# Patient Record
Sex: Female | Born: 1995 | Race: Black or African American | Hispanic: No | Marital: Single | Smoking: Never smoker
Health system: Southern US, Community
[De-identification: ages and names within clinical notes are randomized; demographics above are authoritative.]

## PROBLEM LIST (undated history)

## (undated) DIAGNOSIS — N3010 Interstitial cystitis (chronic) without hematuria: Secondary | ICD-10-CM

## (undated) HISTORY — PX: NO PAST SURGERIES: SHX2092

---

## 2017-06-03 DIAGNOSIS — N3010 Interstitial cystitis (chronic) without hematuria: Secondary | ICD-10-CM | POA: Insufficient documentation

## 2019-11-26 ENCOUNTER — Emergency Department (HOSPITAL_COMMUNITY)
Admission: EM | Admit: 2019-11-26 | Discharge: 2019-11-26 | Disposition: A | Discharge status: Home / Self Care | Payer: No Typology Code available for payment source | Attending: Emergency Medicine | Admitting: Emergency Medicine

## 2019-11-26 ENCOUNTER — Encounter (HOSPITAL_COMMUNITY): Payer: Self-pay | Admitting: Emergency Medicine

## 2019-11-26 ENCOUNTER — Emergency Department (HOSPITAL_COMMUNITY): Payer: No Typology Code available for payment source

## 2019-11-26 DIAGNOSIS — Y93I9 Activity, other involving external motion: Secondary | ICD-10-CM | POA: Diagnosis not present

## 2019-11-26 DIAGNOSIS — R519 Headache, unspecified: Secondary | ICD-10-CM | POA: Diagnosis not present

## 2019-11-26 DIAGNOSIS — Y92410 Unspecified street and highway as the place of occurrence of the external cause: Secondary | ICD-10-CM | POA: Diagnosis not present

## 2019-11-26 DIAGNOSIS — M25512 Pain in left shoulder: Secondary | ICD-10-CM | POA: Insufficient documentation

## 2019-11-26 DIAGNOSIS — R6884 Jaw pain: Secondary | ICD-10-CM | POA: Diagnosis not present

## 2019-11-26 DIAGNOSIS — Y999 Unspecified external cause status: Secondary | ICD-10-CM | POA: Diagnosis not present

## 2019-11-26 MED ORDER — METHOCARBAMOL 500 MG PO TABS
500.0000 mg | ORAL_TABLET | Freq: Two times a day (BID) | ORAL | 0 refills | Status: DC
Start: 2019-11-26 — End: 2022-09-04

## 2019-11-26 MED ORDER — LIDOCAINE 5 % EX PTCH: 1.0000 | MEDICATED_PATCH | CUTANEOUS | 0 refills | Status: DC

## 2019-11-26 MED ORDER — ACETAMINOPHEN 500 MG PO TABS
1000.0000 mg | ORAL_TABLET | Freq: Once | ORAL | Status: AC
  Administered 2019-11-26: 1000 mg via ORAL
  Filled 2019-11-26: qty 2
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
## 2019-11-26 NOTE — Discharge Instructions (Signed)
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
## 2022-03-19 ENCOUNTER — Ambulatory Visit
Admission: EM | Admit: 2022-03-19 | Discharge: 2022-03-19 | Disposition: A | Discharge status: Home / Self Care | Payer: Commercial Managed Care - PPO | Attending: Physician Assistant | Admitting: Physician Assistant

## 2022-03-19 DIAGNOSIS — J069 Acute upper respiratory infection, unspecified: Secondary | ICD-10-CM

## 2022-03-19 DIAGNOSIS — Z1152 Encounter for screening for COVID-19: Secondary | ICD-10-CM | POA: Diagnosis not present

## 2022-03-19 LAB — RESP PANEL BY RT-PCR (FLU A&B, COVID) ARPGX2
Influenza A by PCR: NEGATIVE
Influenza B by PCR: NEGATIVE
SARS Coronavirus 2 by RT PCR: NEGATIVE

## 2022-03-19 NOTE — ED Triage Notes (Signed)
Pt presents with generalized body aches, bilateral ear pain, ongoing headache, sore throat, and chills X 2 days.

## 2022-03-19 NOTE — ED Provider Notes (Signed)
[facility]    CSN: [id number] Arrival date & time: 03/19/22  1515      History   Chief Complaint Chief Complaint  Patient presents with   Chills   Generalized Body Aches   Headache   Sore Throat    HPI Courtney Huang is a 26 y.o. female.   Patient here today for evaluation of generalized body aches, bilateral ear pain, headache, sore throat and chills she has had 2 days.  Fever has been as high as 102 Fahrenheit.  She has tried over-the-counter medication without significant relief.  She denies any diarrhea but has had some nausea and vomiting.  The history is provided by the patient.  Headache Associated symptoms: congestion, cough, fever, nausea, sore throat and vomiting   Associated symptoms: no abdominal pain, no diarrhea and no ear pain   Sore Throat Associated symptoms include headaches. Pertinent negatives include no abdominal pain and no shortness of breath.    History reviewed. No pertinent past medical history.  There are no problems to display for this patient.   History reviewed. No pertinent surgical history.  OB History   No obstetric history on file.      Home Medications    Prior to Admission medications   Medication Sig Start Date End Date Taking? Authorizing Provider  lidocaine (LIDODERM) 5 % Place 1 patch onto the skin daily. Remove & Discard patch within 12 hours or as directed by MD 11/26/19   Henderly, Britni A, PA-C  methocarbamol (ROBAXIN) 500 MG tablet Take 1 tablet (500 mg total) by mouth 2 (two) times daily. 11/26/19   Henderly, Britni A, PA-C    Family History Family History  Family history unknown: Yes    Social History Social History   Tobacco Use   Smoking status: Never   Smokeless tobacco: Never     Allergies   Patient has no known allergies.   Review of Systems Review of Systems  Constitutional:  Positive for chills, diaphoresis and fever.  HENT:  Positive for congestion and sore throat.  Negative for ear pain.   Eyes:  Negative for discharge and redness.  Respiratory:  Positive for cough. Negative for shortness of breath and wheezing.   Gastrointestinal:  Positive for nausea and vomiting. Negative for abdominal pain and diarrhea.  Neurological:  Positive for headaches.     Physical Exam Triage Vital Signs ED Triage Vitals  Enc Vitals Group     BP 03/19/22 1630 121/85     Pulse Rate 03/19/22 1630 84     Resp 03/19/22 1630 18     Temp 03/19/22 1630 99.1 F (37.3 C)     Temp Source 03/19/22 1630 Oral     SpO2 03/19/22 1630 98 %     Weight --      Height --      Head Circumference --      Peak Flow --      Pain Score 03/19/22 1634 7     Pain Loc --      Pain Edu? --      Excl. in GC? --    No data found.  Updated Vital Signs BP 121/85 (BP Location: Right Arm)   Pulse 84   Temp 99.1 F (37.3 C) (Oral)   Resp 18   LMP 03/14/2022   SpO2 98%   Physical Exam Vitals and nursing note reviewed.  Constitutional:      General: She is not in acute distress.  Appearance: Normal appearance. She is not ill-appearing.  HENT:     Head: Normocephalic and atraumatic.     Nose: Congestion present.     Mouth/Throat:     Mouth: Mucous membranes are moist.     Pharynx: No oropharyngeal exudate or posterior oropharyngeal erythema.  Eyes:     Conjunctiva/sclera: Conjunctivae normal.  Cardiovascular:     Rate and Rhythm: Normal rate and regular rhythm.     Heart sounds: Normal heart sounds. No murmur heard. Pulmonary:     Effort: Pulmonary effort is normal. No respiratory distress.     Breath sounds: Normal breath sounds. No wheezing, rhonchi or rales.  Skin:    General: Skin is warm and dry.  Neurological:     Mental Status: She is alert.  Psychiatric:        Mood and Affect: Mood normal.        Thought Content: Thought content normal.      UC Treatments / Results  Labs (all labs ordered are listed, but only abnormal results are displayed) Labs Reviewed   RESP PANEL BY RT-PCR (FLU A&B, COVID) [id number]    EKG   Radiology No results found.  Procedures Procedures (including critical care time)  Medications Ordered in UC Medications - No data to display  Initial Impression / Assessment and Plan / UC Course  I have reviewed the triage vital signs and the nursing notes.  Pertinent labs & imaging results that were available during my care of the patient were reviewed by me and considered in my medical decision making (see chart for details).    Suspect viral etiology of symptoms.  Will screen for COVID and flu.  Will await results further recommendation.  Encouraged follow-up with any further concerns and recommend symptomatic treatment while awaiting results.  Final Clinical Impressions(s) / UC Diagnoses   Final diagnoses:  Acute upper respiratory infection  Encounter for screening for COVID-19   Discharge Instructions   None    ED Prescriptions   None    PDMP not reviewed this encounter.   Tomi Bamberger, PA-C 03/19/22 1714

## 2022-07-09 NOTE — L&D Delivery Note (Cosign Needed Addendum)
[facility] Delivery Note  Courtney Huang is a 27 y.o. G1P0 s/p SVD at [redacted]w[redacted]d. She was admitted for IOL 2/2 pre-eclampsia without severe features.   ROM: 3h 12m with clear fluid GBS Status: NEGATIVE/-- (09/07 1807) Maximum Maternal Temperature: 98.56F   Labor Progress: Initial SVE: 0.5/thick/-3. She then progressed to complete.   Delivery Date/Time: 03/17/23 0636 Delivery: Called to room and patient was complete and pushing. Head delivered LOA. No nuchal cord present. Shoulder and body delivered in usual fashion. Infant with spontaneous cry, placed on mother's abdomen, dried and stimulated. Cord clamped x 2 after 1-minute delay, and cut by father of baby. Cord blood drawn. Placenta delivered spontaneously with gentle cord traction. Fundus firm with massage and Pitocin. Labia, perineum, vagina, and cervix inspected inspected with a small first degree laceration which was repaired in the usual fashion. Following repair, patient noted to have trickling of blood -- TXA given at this time. If cont to have trickling, would consider Cytotec, Hemabate. Baby Weight: pending  Placenta: Sent to L&D Complications: None Lacerations: small first degree perineal laceration - repaired EBL: 202 mL Analgesia: Unmedicated   Infant:  APGAR (1 MIN): 9  APGAR (5 MINS): 9

## 2022-09-04 ENCOUNTER — Ambulatory Visit: Payer: Managed Care, Other (non HMO)

## 2022-09-04 VITALS — BP 130/82 | HR 67 | Ht 66.0 in | Wt 129.0 lb

## 2022-09-04 DIAGNOSIS — Z3490 Encounter for supervision of normal pregnancy, unspecified, unspecified trimester: Secondary | ICD-10-CM

## 2022-09-04 DIAGNOSIS — Z3201 Encounter for pregnancy test, result positive: Secondary | ICD-10-CM

## 2022-09-04 DIAGNOSIS — Z3200 Encounter for pregnancy test, result unknown: Secondary | ICD-10-CM

## 2022-09-04 DIAGNOSIS — O219 Vomiting of pregnancy, unspecified: Secondary | ICD-10-CM

## 2022-09-04 LAB — POCT PREGNANCY, URINE: Preg Test, Ur: POSITIVE — AB

## 2022-09-04 MED ORDER — PROMETHAZINE HCL 12.5 MG PO TABS: 12.5000 mg | ORAL_TABLET | Freq: Four times a day (QID) | ORAL | 1 refills | Status: DC | PRN

## 2022-09-04 MED ORDER — METOCLOPRAMIDE HCL 5 MG PO TABS: 5.0000 mg | ORAL_TABLET | Freq: Three times a day (TID) | ORAL | 1 refills | Status: DC

## 2022-09-04 NOTE — Progress Notes (Signed)
Possible Pregnancy  Here today for pregnancy confirmation. UPT in office today is positive. Pt reports first positive home UPT 07/09/23. This is an unplanned but desired pregnancy. Pt reports family and partner are very supportive. Reviewed dating with patient:   LMP: 06/08/22 EDD: 03/15/23 12w 4d today FHR 172  OB history reviewed; this is first pregnancy. Reviewed medications and allergies with patient. Reports N/V for past 2 months that has led to weight loss from 140 to 129 lbs. Unable to take prenatal vit due to nausea. Ernestina Patches, MD recommends scheduled Reglan, PRN Phenergan, and vit b6 supplement. Pt will return tomorrow, 09/04/22 for new OB visit with Ernestina Patches, MD.  Annabell Howells, RN 09/04/2022  1:55 PM

## 2022-09-04 NOTE — Patient Instructions (Signed)
Pick up Vitamin B6 at pharmacy!

## 2022-09-05 ENCOUNTER — Other Ambulatory Visit: Payer: Self-pay

## 2022-09-05 ENCOUNTER — Other Ambulatory Visit (HOSPITAL_COMMUNITY)
Admission: RE | Admit: 2022-09-05 | Discharge: 2022-09-05 | Disposition: A | Discharge status: Home / Self Care | Payer: Managed Care, Other (non HMO) | Source: Ambulatory Visit | Attending: Family Medicine | Admitting: Family Medicine

## 2022-09-05 ENCOUNTER — Ambulatory Visit (INDEPENDENT_AMBULATORY_CARE_PROVIDER_SITE_OTHER): Payer: Managed Care, Other (non HMO) | Admitting: Family Medicine

## 2022-09-05 VITALS — BP 132/79 | HR 76 | Wt 127.4 lb

## 2022-09-05 DIAGNOSIS — Z3490 Encounter for supervision of normal pregnancy, unspecified, unspecified trimester: Secondary | ICD-10-CM | POA: Insufficient documentation

## 2022-09-05 DIAGNOSIS — Z8279 Family history of other congenital malformations, deformations and chromosomal abnormalities: Secondary | ICD-10-CM

## 2022-09-05 DIAGNOSIS — O219 Vomiting of pregnancy, unspecified: Secondary | ICD-10-CM | POA: Diagnosis not present

## 2022-09-05 DIAGNOSIS — Z3A08 8 weeks gestation of pregnancy: Secondary | ICD-10-CM

## 2022-09-05 NOTE — Progress Notes (Signed)
INITIAL PRENATAL VISIT  Subjective:   Courtney Huang is being seen today for her first obstetrical visit.  This is a planned pregnancy. This is a desired pregnancy.  She is at 93w6dgestation by LMP. Her obstetrical history is significant for  Nausea/vomiting in pregnancy . Relationship with FOB: spouse, living together. Patient does intend to breast feed. Pregnancy history fully reviewed.  Patient reports no complaints.  Indications for ASA therapy (per uptodate) One of the following: Previous pregnancy with preeclampsia, especially early onset and with an adverse outcome No Multifetal gestation No Chronic hypertension No Type 1 or 2 diabetes mellitus No Chronic kidney disease No Autoimmune disease (antiphospholipid syndrome, systemic lupus erythematosus) No  Two or more of the following: Nulliparity Yes Obesity (body mass index >30 kg/m2) No Family history of preeclampsia in mother or sister No Age ?35 years No Sociodemographic characteristics (African American race, low socioeconomic level) Yes Personal risk factors (eg, previous pregnancy with low birth weight or small for gestational age infant, previous adverse pregnancy outcome [eg, stillbirth], interval >10 years between pregnancies) No  Indications for early GDM screening  First-degree relative with diabetes No BMI >30kg/m2 No Age > 25 Yes Previous birth of an infant weighing ?4000 g No Gestational diabetes mellitus in a previous pregnancy No Glycated hemoglobin ?5.7 percent (39 mmol/mol), impaired glucose tolerance, or impaired fasting glucose on previous testing No High-risk race/ethnicity (eg, African American, Latino, Native American, Asian American, Pacific Islander) Yes Previous stillbirth of unknown cause No Maternal birthweight > 9 lbs No History of cardiovascular disease No Hypertension or on therapy for hypertension No High-density lipoprotein cholesterol level <35 mg/dL (0.90 mmol/L) and/or a  triglyceride level >250 mg/dL (2.82 mmol/L) No Polycystic ovary syndrome No Physical inactivity No Other clinical condition associated with insulin resistance (eg, severe obesity, acanthosis nigricans) No Current use of glucocorticoids No   Early screening tests: FBS, A1C, Random CBG, glucose challenge   Review of Systems:   Review of Systems  Objective:    Obstetric History OB History  Gravida Para Term Preterm AB Living  1            SAB IAB Ectopic Multiple Live Births               # Outcome Date GA Lbr Len/2nd Weight Sex Delivery Anes PTL Lv  1 Current             No past medical history on file.  No past surgical history on file.  Current Outpatient Medications on File Prior to Visit  Medication Sig Dispense Refill   Prenatal Vit-Fe Fumarate-FA (MULTIVITAMIN-PRENATAL) 27-0.8 MG TABS tablet Take 1 tablet by mouth daily at 12 noon.     promethazine (PHENERGAN) 12.5 MG tablet Take 1 tablet (12.5 mg total) by mouth every 6 (six) hours as needed for nausea or vomiting. 30 tablet 1   Meth-Hyo-M Bl-Na Phos-Ph Sal (URIBEL) 118 MG CAPS Take by mouth. (Patient not taking: Reported on 09/04/2022)     metoCLOPramide (REGLAN) 5 MG tablet Take 1 tablet (5 mg total) by mouth every 8 (eight) hours. (Patient not taking: Reported on 09/05/2022) 90 tablet 1   phenazopyridine (PYRIDIUM) 100 MG tablet Take by mouth. (Patient not taking: Reported on 09/05/2022)     No current facility-administered medications on file prior to visit.    Allergies  Allergen Reactions   Other Hives    Social History:  reports that she has never smoked. She has never used smokeless tobacco.  Family History  Family history unknown: Yes    The following portions of the patient's history were reviewed and updated as appropriate: allergies, current medications, past family history, past medical history, past social history, past surgical history and problem list.  Review of Systems Review of Systems   Constitutional:  Negative for chills and fever.  HENT:  Negative for congestion and sore throat.   Eyes:  Negative for pain and visual disturbance.  Respiratory:  Negative for cough, chest tightness and shortness of breath.   Cardiovascular:  Negative for chest pain.  Gastrointestinal:  Negative for abdominal pain, diarrhea, nausea and vomiting.  Endocrine: Negative for cold intolerance and heat intolerance.  Genitourinary:  Negative for dysuria and flank pain.  Musculoskeletal:  Negative for back pain.  Skin:  Negative for rash.  Allergic/Immunologic: Negative for food allergies.  Neurological:  Negative for dizziness and light-headedness.  Psychiatric/Behavioral:  Negative for agitation.       Physical Exam:  BP 132/79   Pulse 76   Wt 127 lb 6.4 oz (57.8 kg)   LMP 07/05/2022   BMI 20.56 kg/m  CONSTITUTIONAL: Well-developed, well-nourished female in no acute distress.  HENT:  Normocephalic, atraumatic, External right and left ear normal. Oropharynx is clear and moist EYES: Conjunctivae normal. No scleral icterus.  NECK: Normal range of motion, supple, no masses.  Normal thyroid.  SKIN: Skin is warm and dry. No rash noted. Not diaphoretic. No erythema. No pallor. MUSCULOSKELETAL: Normal range of motion. No tenderness.  No cyanosis, clubbing, or edema.   NEUROLOGIC: Alert and oriented to person, place, and time. Normal muscle tone coordination.  PSYCHIATRIC: Normal mood and affect. Normal behavior. Normal judgment and thought content. CARDIOVASCULAR: Normal heart rate noted, regular rhythm RESPIRATORY: Clear to auscultation bilaterally. Effort and breath sounds normal, no problems with respiration noted. BREASTS: Symmetric in size. No masses, skin changes, nipple drainage, or lymphadenopathy. ABDOMEN: Soft, normal bowel sounds, no distention noted.  No tenderness, rebound or guarding. Fundal ht: 8wk PELVIC: Normal appearing external genitalia; normal appearing vaginal mucosa and  cervix.  No abnormal discharge noted.  Pap smear obtained.  Normal uterine size, no other palpable masses, no uterine or adnexal tenderness. FHR: 174   Assessment:    Pregnancy: G1P0000 1. Encounter for supervision of low-risk pregnancy, antepartum - question about dating-- we will refer for dating [place] to make sure we have the correct EDD - CBC/D/Plt+RPR+Rh+ABO+RubIgG... - Culture, OB Urine - Hemoglobin A1c - Cytology - PAP( [place]) - [place] MFM OB COMP + 14 WK; Future  2. Nausea and vomiting during pregnancy prior to [redacted] weeks gestation Taking phenergan q6 and dramamine -- reports not vomiting in the last 24 hrs. Previously was vomiting 4 times Has lost 10 lbs since beginning pregnancy Has not started reglan  3. Family history of congenital heart disease Father with PDA  Plan:    Initial labs drawn. Prenatal vitamins. Problem list reviewed and updated. Reviewed in detail the nature of the practice with collaborative care between  Genetic screening discussed: NIPS-- not yet gestational age for NIPS but will get this next week. . Role of ultrasound in pregnancy discussed; Anatomy US: ordered. Amniocentesis discussed: not indicated. Follow up in 4 weeks. Discussed clinic routines, schedule of care and testing, genetic screening options, involvement of students and residents under the direct supervision of APPs and doctors and presence of female providers. Pt verbalized understanding.  Return in about 4 weeks (around 10/03/2022) for Mom+Baby Combined Care.   Future Appointments  Date Time Provider [place]  09/14/2022 10:00 AM [facility]     Caren Macadam, MD 09/05/2022 4:00 PM

## 2022-09-06 LAB — HEMOGLOBIN A1C
Est. average glucose Bld gHb Est-mCnc: 108 mg/dL
Hgb A1c MFr Bld: 5.4 % (ref 4.8–5.6)

## 2022-09-06 LAB — CBC/D/PLT+RPR+RH+ABO+RUBIGG...
Antibody Screen: NEGATIVE
Basophils Absolute: 0 10*3/uL (ref 0.0–0.2)
Basos: 1 %
EOS (ABSOLUTE): 0.1 10*3/uL (ref 0.0–0.4)
Eos: 3 %
HCV Ab: NONREACTIVE
HIV Screen 4th Generation wRfx: NONREACTIVE
Hematocrit: 37.8 % (ref 34.0–46.6)
Hemoglobin: 12.4 g/dL (ref 11.1–15.9)
Hepatitis B Surface Ag: NEGATIVE
Immature Grans (Abs): 0 10*3/uL (ref 0.0–0.1)
Immature Granulocytes: 0 %
Lymphocytes Absolute: 1.4 10*3/uL (ref 0.7–3.1)
Lymphs: 32 %
MCH: 26.6 pg (ref 26.6–33.0)
MCHC: 32.8 g/dL (ref 31.5–35.7)
MCV: 81 fL (ref 79–97)
Monocytes Absolute: 0.4 10*3/uL (ref 0.1–0.9)
Monocytes: 8 %
Neutrophils Absolute: 2.4 10*3/uL (ref 1.4–7.0)
Neutrophils: 56 %
Platelets: 259 10*3/uL (ref 150–450)
RBC: 4.67 x10E6/uL (ref 3.77–5.28)
RDW: 15.8 % — ABNORMAL HIGH (ref 11.7–15.4)
RPR Ser Ql: NONREACTIVE
Rh Factor: POSITIVE
Rubella Antibodies, IGG: 3.87 index (ref >0.99)
WBC: 4.3 10*3/uL (ref 3.4–10.8)

## 2022-09-06 LAB — HCV INTERPRETATION

## 2022-09-07 LAB — URINE CULTURE, OB REFLEX

## 2022-09-07 LAB — CULTURE, OB URINE

## 2022-09-10 LAB — CYTOLOGY - PAP
Chlamydia: NEGATIVE
Comment: NEGATIVE
Comment: NEGATIVE
Comment: NEGATIVE
Comment: NORMAL
Diagnosis: UNDETERMINED — AB
High risk HPV: POSITIVE — AB
Neisseria Gonorrhea: NEGATIVE
Trichomonas: NEGATIVE

## 2022-09-11 ENCOUNTER — Encounter: Payer: Self-pay | Admitting: Family Medicine

## 2022-09-11 ENCOUNTER — Ambulatory Visit
Admission: RE | Admit: 2022-09-11 | Discharge: 2022-09-11 | Disposition: A | Discharge status: Home / Self Care | Payer: Managed Care, Other (non HMO) | Source: Ambulatory Visit | Attending: Family Medicine | Admitting: Family Medicine

## 2022-09-11 DIAGNOSIS — Z3687 Encounter for antenatal screening for uncertain dates: Secondary | ICD-10-CM | POA: Diagnosis present

## 2022-09-11 DIAGNOSIS — Z3490 Encounter for supervision of normal pregnancy, unspecified, unspecified trimester: Secondary | ICD-10-CM

## 2022-09-11 DIAGNOSIS — Z3A10 10 weeks gestation of pregnancy: Secondary | ICD-10-CM | POA: Insufficient documentation

## 2022-09-12 ENCOUNTER — Encounter: Payer: Self-pay | Admitting: Family Medicine

## 2022-09-12 DIAGNOSIS — R87610 Atypical squamous cells of undetermined significance on cytologic smear of cervix (ASC-US): Secondary | ICD-10-CM | POA: Insufficient documentation

## 2022-09-12 DIAGNOSIS — Z3491 Encounter for supervision of normal pregnancy, unspecified, first trimester: Secondary | ICD-10-CM

## 2022-09-14 ENCOUNTER — Other Ambulatory Visit: Payer: Managed Care, Other (non HMO)

## 2022-09-17 ENCOUNTER — Encounter: Payer: Self-pay | Admitting: Family Medicine

## 2022-09-17 ENCOUNTER — Other Ambulatory Visit: Payer: Self-pay

## 2022-09-17 ENCOUNTER — Other Ambulatory Visit (HOSPITAL_COMMUNITY)
Admission: RE | Admit: 2022-09-17 | Discharge: 2022-09-17 | Disposition: A | Discharge status: Home / Self Care | Payer: Managed Care, Other (non HMO) | Source: Ambulatory Visit | Attending: Family Medicine | Admitting: Family Medicine

## 2022-09-17 ENCOUNTER — Ambulatory Visit (INDEPENDENT_AMBULATORY_CARE_PROVIDER_SITE_OTHER): Payer: Managed Care, Other (non HMO) | Admitting: Family Medicine

## 2022-09-17 ENCOUNTER — Other Ambulatory Visit (HOSPITAL_COMMUNITY): Payer: Self-pay

## 2022-09-17 VITALS — BP 112/73 | HR 73 | Wt 131.4 lb

## 2022-09-17 DIAGNOSIS — Z3491 Encounter for supervision of normal pregnancy, unspecified, first trimester: Secondary | ICD-10-CM

## 2022-09-17 DIAGNOSIS — N898 Other specified noninflammatory disorders of vagina: Secondary | ICD-10-CM | POA: Insufficient documentation

## 2022-09-17 DIAGNOSIS — B379 Candidiasis, unspecified: Secondary | ICD-10-CM

## 2022-09-17 MED ORDER — CLOTRIMAZOLE 1 % VA CREA
1.0000 | TOPICAL_CREAM | Freq: Every day | VAGINAL | 2 refills | Status: DC
  Filled 2022-09-17: qty 45, 7d supply, fill #0
  Filled 2022-09-28: qty 45, 7d supply, fill #1

## 2022-09-17 NOTE — Progress Notes (Signed)
PRENATAL VISIT NOTE  Subjective:  Courtney Huang is a 27 y.o. G1P0 at 75w4dbeing seen today for ongoing prenatal care.  She is currently monitored for the following issues for this low-risk pregnancy and has IC (interstitial cystitis); Supervision of low-risk pregnancy; Family history of congenital heart disease; and ASCUS with positive high risk HPV cervical on their problem list.  Patient reports no complaints.  Contractions: Not present. Vag. Bleeding: None.  Movement: Absent. Denies leaking of fluid.   The following portions of the patient's history were reviewed and updated as appropriate: allergies, current medications, past family history, past medical history, past social history, past surgical history and problem list.   Objective:   Vitals:   09/17/22 0858  BP: 112/73  Pulse: 73  Weight: 131 lb 6.4 oz (59.6 kg)    Fetal Status: Fetal Heart Rate (bpm): 173   Movement: Absent     General:  Alert, oriented and cooperative. Patient is in no acute distress.  Skin: Skin is warm and dry. No rash noted.   Cardiovascular: Normal heart rate noted  Respiratory: Normal respiratory effort, no problems with respiration noted  Abdomen: Soft, gravid, appropriate for gestational age.  Pain/Pressure: Present     Pelvic: Cervical exam performed in the presence of a chaperone        Extremities: Normal range of motion.  Edema: Trace  Mental Status: Normal mood and affect. Normal behavior. Normal judgment and thought content.    27y.o. G1P0 here for colposcopy for ASCUS with POSITIVE high risk HPV pap smear on 09/05/22. Discussed role for HPV in cervical dysplasia, need for surveillance.  Patient gave informed written consent, time out was performed.  Placed in lithotomy position. Cervix viewed with speculum and colposcope after application of acetic acid.   Colposcopy adequate? No-- unable to do ECC due to pregnancy  acetowhite lesion(s) noted at 12-3  o'clock;   Physical  Exam Exam conducted with a chaperone present.  Genitourinary:    General: Normal vulva.     Exam position: Lithotomy position.     Vagina: Vaginal discharge (clumpy, thick cottage cheese like discharge) present.         Corresponding biopsies obtained. All specimens were labeled and sent to pathology.  Chaperone was present during entire procedure.  Patient was given post procedure instructions.  Will follow up pathology and manage accordingly; patient will be contacted with results and recommendations.  Routine preventative health maintenance measures emphasized.        Assessment and Plan:  Pregnancy: G1P0 at 154w4d1. Encounter for supervision of low-risk pregnancy in first trimester Up to date Genetic screening drawn today - Panorama Prenatal Test Full Panel - HOR[facility]ON CUSTOM - Surgical pathology( [place]/ [place] 2. Vaginal discharge Exam consistent with yeast Discussed trial of cream first and then diflucan if not responding - Cervicovaginal ancillary only( [place]) - clotrimazole (GYNE-LOTRIMIN) 1 % vaginal cream; Place 1 Applicatorful vaginally at bedtime.  Dispense: 30 g; Refill: 2  3. Candida infection - clotrimazole (GYNE-LOTRIMIN) 1 % vaginal cream; Place 1 Applicatorful vaginally at bedtime.  Dispense: 30 g; Refill: 2  Preterm labor symptoms and general obstetric precautions including but not limited to vaginal bleeding, contractions, leaking of fluid and fetal movement were reviewed in detail with the patient. Please refer to After Visit Summary for other counseling recommendations.   Return in about 3 weeks (around 10/08/2022) for Routine prenatal care, Mom+Baby Combined Care.  Future Appointments  Date Time  Provider Department Center  10/08/2022  9:15 AM Caren Macadam, MD [facility]  11/15/2022  9:45 AM [facility] US4 [facility] [place]    Caren Macadam, MD

## 2022-09-18 LAB — CERVICOVAGINAL ANCILLARY ONLY
Bacterial Vaginitis (gardnerella): POSITIVE — AB
Candida Glabrata: NEGATIVE
Candida Vaginitis: POSITIVE — AB
Comment: NEGATIVE
Comment: NEGATIVE
Comment: NEGATIVE

## 2022-09-19 ENCOUNTER — Other Ambulatory Visit (HOSPITAL_COMMUNITY): Payer: Self-pay

## 2022-09-19 LAB — SURGICAL PATHOLOGY

## 2022-09-22 LAB — PANORAMA PRENATAL TEST FULL PANEL:PANORAMA TEST PLUS 5 ADDITIONAL MICRODELETIONS: FETAL FRACTION: 12.3

## 2022-09-24 LAB — HORIZON CUSTOM: REPORT SUMMARY: NEGATIVE

## 2022-09-29 ENCOUNTER — Other Ambulatory Visit (HOSPITAL_COMMUNITY): Payer: Self-pay

## 2022-10-03 ENCOUNTER — Encounter: Payer: Self-pay | Admitting: *Deleted

## 2022-10-08 ENCOUNTER — Encounter: Payer: Managed Care, Other (non HMO) | Admitting: Family Medicine

## 2022-10-12 ENCOUNTER — Encounter: Payer: Managed Care, Other (non HMO) | Admitting: Family Medicine

## 2022-10-12 ENCOUNTER — Telehealth: Payer: Self-pay | Admitting: Family Medicine

## 2022-10-12 NOTE — Telephone Encounter (Signed)
Would like to reschedule appt

## 2022-10-17 ENCOUNTER — Encounter: Payer: Self-pay | Admitting: Family Medicine

## 2022-10-17 ENCOUNTER — Ambulatory Visit (INDEPENDENT_AMBULATORY_CARE_PROVIDER_SITE_OTHER): Payer: Managed Care, Other (non HMO) | Admitting: Family Medicine

## 2022-10-17 ENCOUNTER — Other Ambulatory Visit: Payer: Self-pay

## 2022-10-17 VITALS — BP 105/71 | HR 109 | Wt 133.0 lb

## 2022-10-17 DIAGNOSIS — Z8279 Family history of other congenital malformations, deformations and chromosomal abnormalities: Secondary | ICD-10-CM

## 2022-10-17 DIAGNOSIS — Z3491 Encounter for supervision of normal pregnancy, unspecified, first trimester: Secondary | ICD-10-CM

## 2022-10-17 NOTE — Patient Instructions (Signed)

## 2022-10-17 NOTE — Progress Notes (Signed)
   PRENATAL VISIT NOTE  Subjective:  Courtney Huang is a 27 y.o. G1P0 at [redacted]w[redacted]d being seen today for ongoing prenatal care.  She is currently monitored for the following issues for this low-risk pregnancy and has IC (interstitial cystitis); Supervision of low-risk pregnancy; Family history of congenital heart disease; and ASCUS with positive high risk HPV cervical on their problem list.  Patient reports no complaints.  Contractions: Irritability. Vag. Bleeding: None.  Movement: Absent. Denies leaking of fluid.   The following portions of the patient's history were reviewed and updated as appropriate: allergies, current medications, past family history, past medical history, past social history, past surgical history and problem list.   Objective:   Vitals:   10/17/22 1330  BP: 105/71  Pulse: (!) 109  Weight: 133 lb (60.3 kg)    Fetal Status: Fetal Heart Rate (bpm): 170   Movement: Absent     General:  Alert, oriented and cooperative. Patient is in no acute distress.  Skin: Skin is warm and dry. No rash noted.   Cardiovascular: Normal heart rate noted  Respiratory: Normal respiratory effort, no problems with respiration noted  Abdomen: Soft, gravid, appropriate for gestational age.  Pain/Pressure: Present     Pelvic: Cervical exam deferred        Extremities: Normal range of motion.  Edema: Trace  Mental Status: Normal mood and affect. Normal behavior. Normal judgment and thought content.   Assessment and Plan:  Pregnancy: G1P0 at [redacted]w[redacted]d 1. Encounter for supervision of low-risk pregnancy in first trimester Declined AFP Questions about taking iron No movement yet Has [place] scheduled  2. Family history of congenital heart disease FOB with PDA  Preterm labor symptoms and general obstetric precautions including but not limited to vaginal bleeding, contractions, leaking of fluid and fetal movement were reviewed in detail with the patient. Please refer to After Visit Summary for  other counseling recommendations.   Return for Mom+Baby Combined Care.  Future Appointments  Date Time Provider Department Center  11/08/2022  9:35 AM Venora Maples, MD [facility]  11/15/2022  9:45 AM [facility]  12/06/2022 10:15 AM Venora Maples, MD [facility]    Federico Flake, MD

## 2022-11-08 ENCOUNTER — Other Ambulatory Visit: Payer: Self-pay

## 2022-11-08 ENCOUNTER — Ambulatory Visit: Payer: Managed Care, Other (non HMO) | Admitting: Obstetrics and Gynecology

## 2022-11-08 ENCOUNTER — Encounter: Payer: Self-pay | Admitting: Obstetrics and Gynecology

## 2022-11-08 VITALS — BP 117/72 | HR 89 | Wt 137.0 lb

## 2022-11-08 DIAGNOSIS — Z3A18 18 weeks gestation of pregnancy: Secondary | ICD-10-CM

## 2022-11-08 DIAGNOSIS — O219 Vomiting of pregnancy, unspecified: Secondary | ICD-10-CM

## 2022-11-08 DIAGNOSIS — Z3402 Encounter for supervision of normal first pregnancy, second trimester: Secondary | ICD-10-CM

## 2022-11-08 NOTE — Progress Notes (Signed)
   Subjective:  Courtney Huang is a 27 y.o. G1P0 at [redacted]w[redacted]d being seen today for ongoing prenatal care.  She is currently monitored for the following issues for this low-risk pregnancy and has IC (interstitial cystitis); Supervision of low-risk pregnancy; Family history of congenital heart disease; and ASCUS with positive high risk HPV cervical on their problem list.  Patient reports no complaints.  Contractions: Not present. Vag. Bleeding: None.  Movement: Present. Denies leaking of fluid.   The following portions of the patient's history were reviewed and updated as appropriate: allergies, current medications, past family history, past medical history, past social history, past surgical history and problem list. Problem list updated.  Objective:   Vitals:   11/08/22 0956  BP: 117/72  Pulse: 89  Weight: 137 lb (62.1 kg)    Fetal Status: Fetal Heart Rate (bpm): 164   Movement: Present     General:  Alert, oriented and cooperative. Patient is in no acute distress.  Skin: Skin is warm and dry. No rash noted.   Cardiovascular: Normal heart rate noted  Respiratory: Normal respiratory effort, no problems with respiration noted  Abdomen: Soft, gravid, appropriate for gestational age. Pain/Pressure: Absent     Pelvic: Vag. Bleeding: None     Cervical exam deferred        Extremities: Normal range of motion.     Mental Status: Normal mood and affect. Normal behavior. Normal judgment and thought content.    Assessment and Plan:  Pregnancy: G1P0 at [redacted]w[redacted]d  1. Supervision of low-risk first pregnancy, second trimester BP and FHR normal  Feeling vigorous movement  2. [redacted] weeks gestation of pregnancy Anatomy scan next week Doing well, no concerns  3. Nausea and vomiting during pregnancy prior to [redacted] weeks gestation Doing well, dramamine once a day as needed. Able to keep food down.    Preterm labor symptoms and general obstetric precautions including but not limited to vaginal  bleeding, contractions, leaking of fluid and fetal movement were reviewed in detail with the patient. Please refer to After Visit Summary for other counseling recommendations.   Future Appointments  Date Time Provider Department Center  11/15/2022  9:45 AM [facility]  12/06/2022 10:15 AM Venora Maples, MD [facility]  01/02/2023  8:20 AM [facility]  01/02/2023  9:55 AM Alvester Morin, Isa Rankin, MD [facility]    Sue Lush, FNP

## 2022-11-13 ENCOUNTER — Encounter: Payer: Self-pay | Admitting: Family Medicine

## 2022-11-15 ENCOUNTER — Ambulatory Visit: Payer: Managed Care, Other (non HMO) | Attending: Family Medicine

## 2022-11-15 ENCOUNTER — Other Ambulatory Visit: Payer: Self-pay | Admitting: *Deleted

## 2022-11-15 ENCOUNTER — Other Ambulatory Visit: Payer: Self-pay | Admitting: Family Medicine

## 2022-11-15 ENCOUNTER — Encounter: Payer: Self-pay | Admitting: Family Medicine

## 2022-11-15 DIAGNOSIS — Z3A19 19 weeks gestation of pregnancy: Secondary | ICD-10-CM | POA: Insufficient documentation

## 2022-11-15 DIAGNOSIS — Z363 Encounter for antenatal screening for malformations: Secondary | ICD-10-CM | POA: Diagnosis present

## 2022-11-15 DIAGNOSIS — Z3490 Encounter for supervision of normal pregnancy, unspecified, unspecified trimester: Secondary | ICD-10-CM

## 2022-11-15 DIAGNOSIS — Z8279 Family history of other congenital malformations, deformations and chromosomal abnormalities: Secondary | ICD-10-CM

## 2022-11-15 DIAGNOSIS — O321XX0 Maternal care for breech presentation, not applicable or unspecified: Secondary | ICD-10-CM | POA: Insufficient documentation

## 2022-11-15 DIAGNOSIS — O358XX0 Maternal care for other (suspected) fetal abnormality and damage, not applicable or unspecified: Secondary | ICD-10-CM | POA: Diagnosis not present

## 2022-11-15 DIAGNOSIS — Z362 Encounter for other antenatal screening follow-up: Secondary | ICD-10-CM

## 2022-11-15 DIAGNOSIS — O4440 Low lying placenta NOS or without hemorrhage, unspecified trimester: Secondary | ICD-10-CM | POA: Insufficient documentation

## 2022-11-15 DIAGNOSIS — O4442 Low lying placenta NOS or without hemorrhage, second trimester: Secondary | ICD-10-CM | POA: Diagnosis not present

## 2022-11-15 DIAGNOSIS — O219 Vomiting of pregnancy, unspecified: Secondary | ICD-10-CM

## 2022-12-06 ENCOUNTER — Ambulatory Visit: Payer: Managed Care, Other (non HMO) | Admitting: Family Medicine

## 2022-12-06 ENCOUNTER — Other Ambulatory Visit: Payer: Self-pay

## 2022-12-06 ENCOUNTER — Encounter: Payer: Self-pay | Admitting: Family Medicine

## 2022-12-06 VITALS — BP 107/63 | HR 85 | Wt 144.4 lb

## 2022-12-06 DIAGNOSIS — N3010 Interstitial cystitis (chronic) without hematuria: Secondary | ICD-10-CM

## 2022-12-06 DIAGNOSIS — Z8279 Family history of other congenital malformations, deformations and chromosomal abnormalities: Secondary | ICD-10-CM

## 2022-12-06 DIAGNOSIS — Z3492 Encounter for supervision of normal pregnancy, unspecified, second trimester: Secondary | ICD-10-CM

## 2022-12-06 DIAGNOSIS — R87610 Atypical squamous cells of undetermined significance on cytologic smear of cervix (ASC-US): Secondary | ICD-10-CM

## 2022-12-06 DIAGNOSIS — O4440 Low lying placenta NOS or without hemorrhage, unspecified trimester: Secondary | ICD-10-CM

## 2022-12-06 DIAGNOSIS — R87810 Cervical high risk human papillomavirus (HPV) DNA test positive: Secondary | ICD-10-CM

## 2022-12-06 NOTE — Patient Instructions (Signed)

## 2022-12-06 NOTE — Progress Notes (Signed)
   Subjective:  Courtney Huang is a 27 y.o. G1P0 at [redacted]w[redacted]d being seen today for ongoing prenatal care.  She is currently monitored for the following issues for this high-risk pregnancy and has IC (interstitial cystitis); Supervision of low-risk pregnancy; Family history of congenital heart disease; ASCUS with positive high risk HPV cervical; and Low-lying placenta on their problem list.  Patient reports no complaints.  Contractions: Irritability. Vag. Bleeding: None.  Movement: Present. Denies leaking of fluid.   The following portions of the patient's history were reviewed and updated as appropriate: allergies, current medications, past family history, past medical history, past social history, past surgical history and problem list. Problem list updated.  Objective:   Vitals:   12/06/22 1041  BP: 107/63  Pulse: 85  Weight: 144 lb 6.4 oz (65.5 kg)    Fetal Status: Fetal Heart Rate (bpm): 153   Movement: Present     General:  Alert, oriented and cooperative. Patient is in no acute distress.  Skin: Skin is warm and dry. No rash noted.   Cardiovascular: Normal heart rate noted  Respiratory: Normal respiratory effort, no problems with respiration noted  Abdomen: Soft, gravid, appropriate for gestational age. Pain/Pressure: Present     Pelvic: Vag. Bleeding: None     Cervical exam deferred        Extremities: Normal range of motion.     Mental Status: Normal mood and affect. Normal behavior. Normal judgment and thought content.   Urinalysis:      Assessment and Plan:  Pregnancy: G1P0 at [redacted]w[redacted]d  1. Encounter for supervision of low-risk pregnancy in second trimester BP and FHR normal Declines AFP Discussed fasting labs for next visit  2. Low-lying placenta Noted on anatomy scan from 11/15/2022, 1.1 cm from internal os  3. ASCUS with positive high risk HPV cervical S/p colpo CIN 1, repeat pap in 1 year  4. IC (interstitial cystitis) Reports she is doing well, not needing  uribel as often  5. Family history of congenital heart disease Father born with PDA Discussed this is an indication for fetal echo, will schedule w Dr. Elizebeth Brooking  Preterm labor symptoms and general obstetric precautions including but not limited to vaginal bleeding, contractions, leaking of fluid and fetal movement were reviewed in detail with the patient. Please refer to After Visit Summary for other counseling recommendations.  Return in 4 weeks (on 01/03/2023) for [facility] patient, ob visit.   Venora Maples, MD

## 2022-12-20 ENCOUNTER — Ambulatory Visit: Payer: Medicaid Other | Attending: Obstetrics

## 2022-12-20 ENCOUNTER — Other Ambulatory Visit: Payer: Self-pay | Admitting: *Deleted

## 2022-12-20 ENCOUNTER — Ambulatory Visit: Payer: Medicaid Other

## 2022-12-20 DIAGNOSIS — O4442 Low lying placenta NOS or without hemorrhage, second trimester: Secondary | ICD-10-CM

## 2022-12-20 DIAGNOSIS — Z3A24 24 weeks gestation of pregnancy: Secondary | ICD-10-CM

## 2022-12-20 DIAGNOSIS — Z362 Encounter for other antenatal screening follow-up: Secondary | ICD-10-CM | POA: Diagnosis present

## 2022-12-20 DIAGNOSIS — O3662X0 Maternal care for excessive fetal growth, second trimester, not applicable or unspecified: Secondary | ICD-10-CM

## 2022-12-20 DIAGNOSIS — O4440 Low lying placenta NOS or without hemorrhage, unspecified trimester: Secondary | ICD-10-CM

## 2022-12-31 ENCOUNTER — Other Ambulatory Visit: Payer: Self-pay

## 2022-12-31 DIAGNOSIS — Z3490 Encounter for supervision of normal pregnancy, unspecified, unspecified trimester: Secondary | ICD-10-CM

## 2023-01-02 ENCOUNTER — Other Ambulatory Visit: Payer: Medicaid Other

## 2023-01-02 ENCOUNTER — Other Ambulatory Visit: Payer: Self-pay

## 2023-01-02 ENCOUNTER — Ambulatory Visit (INDEPENDENT_AMBULATORY_CARE_PROVIDER_SITE_OTHER): Payer: Medicaid Other | Admitting: Family Medicine

## 2023-01-02 VITALS — BP 110/65 | HR 86 | Wt 149.2 lb

## 2023-01-02 DIAGNOSIS — Z8279 Family history of other congenital malformations, deformations and chromosomal abnormalities: Secondary | ICD-10-CM

## 2023-01-02 DIAGNOSIS — O4442 Low lying placenta NOS or without hemorrhage, second trimester: Secondary | ICD-10-CM | POA: Diagnosis not present

## 2023-01-02 DIAGNOSIS — Z3493 Encounter for supervision of normal pregnancy, unspecified, third trimester: Secondary | ICD-10-CM

## 2023-01-02 DIAGNOSIS — Z3A26 26 weeks gestation of pregnancy: Secondary | ICD-10-CM

## 2023-01-02 DIAGNOSIS — Z1332 Encounter for screening for maternal depression: Secondary | ICD-10-CM

## 2023-01-02 DIAGNOSIS — R87810 Cervical high risk human papillomavirus (HPV) DNA test positive: Secondary | ICD-10-CM

## 2023-01-02 DIAGNOSIS — N3010 Interstitial cystitis (chronic) without hematuria: Secondary | ICD-10-CM

## 2023-01-02 DIAGNOSIS — Z3490 Encounter for supervision of normal pregnancy, unspecified, unspecified trimester: Secondary | ICD-10-CM

## 2023-01-02 DIAGNOSIS — Z23 Encounter for immunization: Secondary | ICD-10-CM

## 2023-01-02 DIAGNOSIS — R87610 Atypical squamous cells of undetermined significance on cytologic smear of cervix (ASC-US): Secondary | ICD-10-CM

## 2023-01-02 DIAGNOSIS — O4440 Low lying placenta NOS or without hemorrhage, unspecified trimester: Secondary | ICD-10-CM

## 2023-01-02 NOTE — Progress Notes (Unsigned)
   PRENATAL VISIT NOTE  Subjective:  Courtney Huang is a 27 y.o. G1P0 at [redacted]w[redacted]d being seen today for ongoing prenatal care.  She is currently monitored for the following issues for this low-risk pregnancy and has IC (interstitial cystitis); Supervision of low-risk pregnancy; Family history of congenital heart disease; ASCUS with positive high risk HPV cervical; and Low-lying placenta on their problem list.  Patient reports no complaints.  Contractions: Not present. Vag. Bleeding: None.  Movement: Present. Denies leaking of fluid.   The following portions of the patient's history were reviewed and updated as appropriate: allergies, current medications, past family history, past medical history, past social history, past surgical history and problem list.   Objective:   Vitals:   01/02/23 1049  BP: 110/65  Pulse: 86  Weight: 149 lb 3.2 oz (67.7 kg)    Fetal Status: Fetal Heart Rate (bpm): 145 Fundal Height: 28 cm Movement: Present     General:  Alert, oriented and cooperative. Patient is in no acute distress.  Skin: Skin is warm and dry. No rash noted.   Cardiovascular: Normal heart rate noted  Respiratory: Normal respiratory effort, no problems with respiration noted  Abdomen: Soft, gravid, appropriate for gestational age.  Pain/Pressure: Present     Pelvic: Cervical exam deferred        Extremities: Normal range of motion.     Mental Status: Normal mood and affect. Normal behavior. Normal judgment and thought content.   Assessment and Plan:  Pregnancy: G1P0 at [redacted]w[redacted]d 1. Low-lying placenta @24 -- placenta Anterior and continues to be low lying at 0.1cm  Follow up in 6-8 week  2. Family history of congenital heart disease FOB with PDA  3. Encounter for supervision of low-risk pregnancy in third trimester Infant was growing well FH appropriate No concerns She is signing up for CBE classes and getting a little nervous She had BH contraction that was 4 minutes long and  painful but none since and not continued.   4. IC (interstitial cystitis)  5. ASCUS with positive high risk HPV cervical   Preterm labor symptoms and general obstetric precautions including but not limited to vaginal bleeding, contractions, leaking of fluid and fetal movement were reviewed in detail with the patient. Please refer to After Visit Summary for other counseling recommendations.   Return in about 2 weeks (around 01/16/2023) for Routine prenatal care, Mom+Baby Combined Care.  Future Appointments  Date Time Provider Department Center  01/14/2023 10:15 AM Federico Flake, MD [facility]  01/31/2023  9:15 AM Venora Maples, MD [facility]  02/07/2023 12:45 PM [facility]    Federico Flake, MD

## 2023-01-03 ENCOUNTER — Other Ambulatory Visit (HOSPITAL_COMMUNITY): Payer: Self-pay

## 2023-01-03 ENCOUNTER — Other Ambulatory Visit: Payer: Self-pay | Admitting: Family Medicine

## 2023-01-03 DIAGNOSIS — O99013 Anemia complicating pregnancy, third trimester: Secondary | ICD-10-CM

## 2023-01-03 LAB — CBC
Hematocrit: 29.8 % — ABNORMAL LOW (ref 34.0–46.6)
Hemoglobin: 9.5 g/dL — ABNORMAL LOW (ref 11.1–15.9)
MCH: 25.4 pg — ABNORMAL LOW (ref 26.6–33.0)
MCHC: 31.9 g/dL (ref 31.5–35.7)
MCV: 80 fL (ref 79–97)
Platelets: 169 10*3/uL (ref 150–450)
RBC: 3.74 x10E6/uL — ABNORMAL LOW (ref 3.77–5.28)
RDW: 13.9 % (ref 11.7–15.4)
WBC: 9 10*3/uL (ref 3.4–10.8)

## 2023-01-03 LAB — GLUCOSE TOLERANCE, 2 HOURS W/ 1HR
Glucose, 1 hour: 109 mg/dL (ref 70–179)
Glucose, 2 hour: 128 mg/dL (ref 70–152)
Glucose, Fasting: 87 mg/dL (ref 70–91)

## 2023-01-03 LAB — RPR: RPR Ser Ql: NONREACTIVE

## 2023-01-03 LAB — HIV ANTIBODY (ROUTINE TESTING W REFLEX): HIV Screen 4th Generation wRfx: NONREACTIVE

## 2023-01-03 MED ORDER — FERROUS SULFATE 324 (65 FE) MG PO TBEC
1.0000 | DELAYED_RELEASE_TABLET | ORAL | 0 refills | Status: DC
Start: 2023-01-03 — End: 2023-02-27
  Filled 2023-01-03: qty 100, 200d supply, fill #0

## 2023-01-04 ENCOUNTER — Other Ambulatory Visit (HOSPITAL_COMMUNITY): Payer: Self-pay

## 2023-01-14 ENCOUNTER — Other Ambulatory Visit (HOSPITAL_COMMUNITY)
Admission: RE | Admit: 2023-01-14 | Discharge: 2023-01-14 | Disposition: A | Discharge status: Home / Self Care | Payer: Medicaid Other | Source: Ambulatory Visit | Attending: Family Medicine | Admitting: Family Medicine

## 2023-01-14 ENCOUNTER — Ambulatory Visit: Payer: Medicaid Other | Admitting: Family Medicine

## 2023-01-14 ENCOUNTER — Other Ambulatory Visit: Payer: Self-pay

## 2023-01-14 VITALS — BP 120/84 | HR 103 | Wt 151.1 lb

## 2023-01-14 DIAGNOSIS — O4440 Low lying placenta NOS or without hemorrhage, unspecified trimester: Secondary | ICD-10-CM

## 2023-01-14 DIAGNOSIS — O4443 Low lying placenta NOS or without hemorrhage, third trimester: Secondary | ICD-10-CM

## 2023-01-14 DIAGNOSIS — N898 Other specified noninflammatory disorders of vagina: Secondary | ICD-10-CM | POA: Diagnosis present

## 2023-01-14 DIAGNOSIS — R87610 Atypical squamous cells of undetermined significance on cytologic smear of cervix (ASC-US): Secondary | ICD-10-CM

## 2023-01-14 DIAGNOSIS — Z3A28 28 weeks gestation of pregnancy: Secondary | ICD-10-CM

## 2023-01-14 DIAGNOSIS — Z3493 Encounter for supervision of normal pregnancy, unspecified, third trimester: Secondary | ICD-10-CM

## 2023-01-14 DIAGNOSIS — R87810 Cervical high risk human papillomavirus (HPV) DNA test positive: Secondary | ICD-10-CM

## 2023-01-14 DIAGNOSIS — O99013 Anemia complicating pregnancy, third trimester: Secondary | ICD-10-CM | POA: Insufficient documentation

## 2023-01-14 NOTE — Progress Notes (Signed)
   PRENATAL VISIT NOTE  Subjective:  Courtney Huang is a 27 y.o. G1P0 at [redacted]w[redacted]d being seen today for ongoing prenatal care.  She is currently monitored for the following issues for this low-risk pregnancy and has IC (interstitial cystitis); Supervision of low-risk pregnancy; Family history of congenital heart disease; ASCUS with positive high risk HPV cervical; Low-lying placenta; and Anemia affecting pregnancy in third trimester on their problem list.  Patient reports no complaints.  Contractions: Not present. Vag. Bleeding: None.  Movement: Present. Denies leaking of fluid.   The following portions of the patient's history were reviewed and updated as appropriate: allergies, current medications, past family history, past medical history, past social history, past surgical history and problem list.   Objective:   Vitals:   01/14/23 1030  BP: 120/84  Pulse: (!) 103  Weight: 151 lb 1.6 oz (68.5 kg)    Fetal Status: Fetal Heart Rate (bpm): 143   Movement: Present     General:  Alert, oriented and cooperative. Patient is in no acute distress.  Skin: Skin is warm and dry. No rash noted.   Cardiovascular: Normal heart rate noted  Respiratory: Normal respiratory effort, no problems with respiration noted  Abdomen: Soft, gravid, appropriate for gestational age.  Pain/Pressure: Present     Pelvic: Cervical exam deferred        Extremities: Normal range of motion.  Edema: Trace  Mental Status: Normal mood and affect. Normal behavior. Normal judgment and thought content.   Assessment and Plan:  Pregnancy: G1P0 at [redacted]w[redacted]d  1. Vaginal discharge C/o vaginal discharge, intermittent - Cervicovaginal ancillary only  2. ASCUS with positive high risk HPV cervical PP colpo  3. Encounter for supervision of low-risk pregnancy in third trimester Lenghty discussion of growth on [place] and what 90th% means  Reviewed  GTT results which were VERY normal Has growth [place] scheduled by MFM to follow  up  4. Low-lying placenta Has f/u US  5. Anemia affecting pregnancy in third trimester Lab Results  Component Value Date   HGB 9.5 (L) 01/02/2023   HGB 12.4 09/05/2022  Taking Fe every other day Recommended Blood Builder supplement Recheck CBC at next visit - orders placed for future  Preterm labor symptoms and general obstetric precautions including but not limited to vaginal bleeding, contractions, leaking of fluid and fetal movement were reviewed in detail with the patient. Please refer to After Visit Summary for other counseling recommendations.   Return in about 2 weeks (around 01/28/2023) for Routine prenatal care, Mom+Baby Combined [facility].  Future Appointments  Date Time Provider Department Center  01/31/2023  9:15 AM Venora Maples, MD [facility]  02/07/2023 12:45 PM [facility] US4 [facility]    Federico Flake, MD

## 2023-01-14 NOTE — Patient Instructions (Signed)
I would recommend you try a supplement called Blood Builder. It is available via [place] and also at stores like [employer]. It uses plant based iron and is less prone to causing stomach and bowel upset. Here is a screen shot of the supplement.

## 2023-01-15 LAB — CERVICOVAGINAL ANCILLARY ONLY
Bacterial Vaginitis (gardnerella): NEGATIVE
Candida Glabrata: NEGATIVE
Candida Vaginitis: NEGATIVE
Comment: NEGATIVE
Comment: NEGATIVE
Comment: NEGATIVE

## 2023-01-31 ENCOUNTER — Ambulatory Visit: Payer: Medicaid Other | Admitting: Family Medicine

## 2023-01-31 ENCOUNTER — Other Ambulatory Visit: Payer: Self-pay

## 2023-01-31 ENCOUNTER — Other Ambulatory Visit (HOSPITAL_COMMUNITY): Payer: Self-pay

## 2023-01-31 ENCOUNTER — Encounter: Payer: Self-pay | Admitting: Family Medicine

## 2023-01-31 VITALS — BP 109/76 | HR 112 | Wt 156.0 lb

## 2023-01-31 DIAGNOSIS — Z3A30 30 weeks gestation of pregnancy: Secondary | ICD-10-CM

## 2023-01-31 DIAGNOSIS — Z3493 Encounter for supervision of normal pregnancy, unspecified, third trimester: Secondary | ICD-10-CM

## 2023-01-31 DIAGNOSIS — O219 Vomiting of pregnancy, unspecified: Secondary | ICD-10-CM

## 2023-01-31 DIAGNOSIS — O99013 Anemia complicating pregnancy, third trimester: Secondary | ICD-10-CM

## 2023-01-31 DIAGNOSIS — Z8279 Family history of other congenital malformations, deformations and chromosomal abnormalities: Secondary | ICD-10-CM

## 2023-01-31 DIAGNOSIS — O4440 Low lying placenta NOS or without hemorrhage, unspecified trimester: Secondary | ICD-10-CM

## 2023-01-31 DIAGNOSIS — O4443 Low lying placenta NOS or without hemorrhage, third trimester: Secondary | ICD-10-CM

## 2023-01-31 LAB — CBC
Hematocrit: 29.8 % — ABNORMAL LOW (ref 34.0–46.6)
Hemoglobin: 9.3 g/dL — ABNORMAL LOW (ref 11.1–15.9)
MCH: 24.5 pg — ABNORMAL LOW (ref 26.6–33.0)
MCHC: 31.2 g/dL — ABNORMAL LOW (ref 31.5–35.7)
MCV: 79 fL (ref 79–97)
Platelets: 184 10*3/uL (ref 150–450)
RBC: 3.79 x10E6/uL (ref 3.77–5.28)
RDW: 13.9 % (ref 11.7–15.4)
WBC: 11.7 10*3/uL — ABNORMAL HIGH (ref 3.4–10.8)

## 2023-01-31 MED ORDER — ONDANSETRON 4 MG PO TBDP
4.0000 mg | ORAL_TABLET | Freq: Four times a day (QID) | ORAL | 0 refills | Status: DC | PRN
Start: 2023-01-31 — End: 2023-02-27
  Filled 2023-01-31: qty 20, 5d supply, fill #0

## 2023-01-31 NOTE — Addendum Note (Signed)
Addended by: Isabell Jarvis on: 01/31/2023 10:29 AM   Modules accepted: Orders

## 2023-01-31 NOTE — Progress Notes (Signed)
   Subjective:  Courtney Huang is a 27 y.o. G1P0 at [redacted]w[redacted]d being seen today for ongoing prenatal care.  She is currently monitored for the following issues for this high-risk pregnancy and has IC (interstitial cystitis); Supervision of low-risk pregnancy; Family history of congenital heart disease; ASCUS with positive high risk HPV cervical; Low-lying placenta; and Anemia affecting pregnancy in third trimester on their problem list.  Patient reports no complaints.  Contractions: Not present. Vag. Bleeding: None.  Movement: Present. Denies leaking of fluid.   The following portions of the patient's history were reviewed and updated as appropriate: allergies, current medications, past family history, past medical history, past social history, past surgical history and problem list. Problem list updated.  Objective:   Vitals:   01/31/23 0932  BP: 109/76  Pulse: (!) 112  Weight: 156 lb (70.8 kg)    Fetal Status: Fetal Heart Rate (bpm): 153   Movement: Present     General:  Alert, oriented and cooperative. Patient is in no acute distress.  Skin: Skin is warm and dry. No rash noted.   Cardiovascular: Normal heart rate noted  Respiratory: Normal respiratory effort, no problems with respiration noted  Abdomen: Soft, gravid, appropriate for gestational age. Pain/Pressure: Present     Pelvic: Vag. Bleeding: None     Cervical exam deferred        Extremities: Normal range of motion.  Edema: Trace  Mental Status: Normal mood and affect. Normal behavior. Normal judgment and thought content.   Urinalysis:      Assessment and Plan:  Pregnancy: G1P0 at [redacted]w[redacted]d  1. Encounter for supervision of low-risk pregnancy in third trimester BP and FHR normal Some nausea and vomiting, likely viral gastro, rx sent for zofran  2. Low-lying placenta Following w MFM, has follow up US scheduled on 02/07/2023  3. Anemia affecting pregnancy in third trimester Lab Results  Component Value Date   HGB 9.5 (L)  01/02/2023   On PO iron, recheck iron studies today  4. Family history of congenital heart disease Father born with PDA at 25 weeks Discussed with Dr. Judeth Cornfield, will send for fetal echo  Preterm labor symptoms and general obstetric precautions including but not limited to vaginal bleeding, contractions, leaking of fluid and fetal movement were reviewed in detail with the patient. Please refer to After Visit Summary for other counseling recommendations.  Return in 2 weeks (on 02/14/2023) for [facility] patient, ob visit.   Venora Maples, MD

## 2023-01-31 NOTE — Patient Instructions (Signed)

## 2023-02-01 ENCOUNTER — Encounter: Payer: Self-pay | Admitting: Family Medicine

## 2023-02-01 ENCOUNTER — Telehealth: Payer: Self-pay | Admitting: Pharmacy Technician

## 2023-02-01 NOTE — Addendum Note (Signed)
Addended by: Merian Capron on: 02/01/2023 12:02 PM   Modules accepted: Orders

## 2023-02-01 NOTE — Telephone Encounter (Signed)
Dr. Crissie Reese, [place] note:  Patient will be scheduled as soon as possible.   Auth Submission: NO AUTH NEEDED Site of care: Site of care: [facility] Payer: [employer] Medication & CPT/J Code(s) submitted: Venofer (Iron Sucrose) J1756 Route of submission (phone, fax, portal):  Phone # Fax # Auth type: Buy/Bill Units/visits requested: 2 Reference number:  Approval from: 02/01/23 to 06/04/23

## 2023-02-07 ENCOUNTER — Ambulatory Visit: Payer: Medicaid Other | Attending: Maternal & Fetal Medicine

## 2023-02-07 DIAGNOSIS — Z3A31 31 weeks gestation of pregnancy: Secondary | ICD-10-CM | POA: Diagnosis not present

## 2023-02-07 DIAGNOSIS — O3663X0 Maternal care for excessive fetal growth, third trimester, not applicable or unspecified: Secondary | ICD-10-CM | POA: Diagnosis not present

## 2023-02-07 DIAGNOSIS — O4443 Low lying placenta NOS or without hemorrhage, third trimester: Secondary | ICD-10-CM

## 2023-02-07 DIAGNOSIS — O4440 Low lying placenta NOS or without hemorrhage, unspecified trimester: Secondary | ICD-10-CM | POA: Insufficient documentation

## 2023-02-11 ENCOUNTER — Other Ambulatory Visit: Payer: Self-pay

## 2023-02-11 ENCOUNTER — Encounter: Payer: Self-pay | Admitting: Family Medicine

## 2023-02-11 ENCOUNTER — Ambulatory Visit (INDEPENDENT_AMBULATORY_CARE_PROVIDER_SITE_OTHER): Payer: Medicaid Other

## 2023-02-11 ENCOUNTER — Ambulatory Visit (INDEPENDENT_AMBULATORY_CARE_PROVIDER_SITE_OTHER): Payer: Medicaid Other | Admitting: Family Medicine

## 2023-02-11 ENCOUNTER — Encounter: Payer: Medicaid Other | Admitting: Family Medicine

## 2023-02-11 VITALS — BP 124/79 | HR 91 | Wt 159.5 lb

## 2023-02-11 VITALS — BP 119/79 | HR 83 | Temp 98.0°F | Resp 18 | Ht 67.0 in | Wt 158.6 lb

## 2023-02-11 DIAGNOSIS — D508 Other iron deficiency anemias: Secondary | ICD-10-CM

## 2023-02-11 DIAGNOSIS — O4443 Low lying placenta NOS or without hemorrhage, third trimester: Secondary | ICD-10-CM

## 2023-02-11 DIAGNOSIS — O99013 Anemia complicating pregnancy, third trimester: Secondary | ICD-10-CM | POA: Diagnosis not present

## 2023-02-11 DIAGNOSIS — Z3A32 32 weeks gestation of pregnancy: Secondary | ICD-10-CM

## 2023-02-11 DIAGNOSIS — O4440 Low lying placenta NOS or without hemorrhage, unspecified trimester: Secondary | ICD-10-CM

## 2023-02-11 DIAGNOSIS — Z8279 Family history of other congenital malformations, deformations and chromosomal abnormalities: Secondary | ICD-10-CM

## 2023-02-11 DIAGNOSIS — R87810 Cervical high risk human papillomavirus (HPV) DNA test positive: Secondary | ICD-10-CM

## 2023-02-11 DIAGNOSIS — R87610 Atypical squamous cells of undetermined significance on cytologic smear of cervix (ASC-US): Secondary | ICD-10-CM

## 2023-02-11 DIAGNOSIS — N3010 Interstitial cystitis (chronic) without hematuria: Secondary | ICD-10-CM

## 2023-02-11 DIAGNOSIS — Z3493 Encounter for supervision of normal pregnancy, unspecified, third trimester: Secondary | ICD-10-CM

## 2023-02-11 MED ORDER — DIPHENHYDRAMINE HCL 25 MG PO CAPS
25.0000 mg | ORAL_CAPSULE | Freq: Once | ORAL | Status: AC
  Administered 2023-02-11: 25 mg via ORAL
  Filled 2023-02-11: qty 1

## 2023-02-11 MED ORDER — SODIUM CHLORIDE 0.9 % IV SOLN
300.0000 mg | INTRAVENOUS | Status: DC
  Administered 2023-02-11: 300 mg via INTRAVENOUS
  Filled 2023-02-11: qty 15

## 2023-02-11 MED ORDER — ACETAMINOPHEN 325 MG PO TABS
650.0000 mg | ORAL_TABLET | Freq: Once | ORAL | Status: AC
  Administered 2023-02-11: 650 mg via ORAL
  Filled 2023-02-11: qty 2

## 2023-02-11 NOTE — Progress Notes (Addendum)
   PRENATAL VISIT NOTE  Subjective:  Courtney Huang is a 27 y.o. G1P0 at 108w3d being seen today for ongoing prenatal care.  She is currently monitored for the following issues for this low-risk pregnancy and has IC (interstitial cystitis); Supervision of low-risk pregnancy; Family history of congenital heart disease; ASCUS with positive high risk HPV cervical; Low-lying placenta; and Anemia affecting pregnancy in third trimester on their problem list.  Patient reports no complaints.  Contractions: Not present. Vag. Bleeding: None.  Movement: Present. Denies leaking of fluid.   The following portions of the patient's history were reviewed and updated as appropriate: allergies, current medications, past family history, past medical history, past social history, past surgical history and problem list.   Objective:   Vitals:   02/11/23 1519  BP: 124/79  Pulse: 91  Weight: 72.3 kg    Fetal Status: Fetal Heart Rate (bpm): 141 Fundal Height: 33 cm Movement: Present     General:  Alert, oriented and cooperative. Patient is in no acute distress.  Skin: Skin is warm and dry. No rash noted.   Cardiovascular: Normal heart rate noted  Respiratory: Normal respiratory effort, no problems with respiration noted  Abdomen: Soft, gravid, appropriate for gestational age.  Pain/Pressure: Present     Pelvic: Cervical exam deferred        Extremities: Normal range of motion.  Edema: Trace  Mental Status: Normal mood and affect. Normal behavior. Normal judgment and thought content.   Assessment and Plan:  Pregnancy: G1P0 at [redacted]w[redacted]d 1. Encounter for supervision of low-risk pregnancy in third trimester Up to date Vigorous movement FH appropriate for GA Questions today about elective vs post-dates induction, PP contraception options, galactagogues, breastfeeding support, need for fetal echo  2. Anemia affecting pregnancy in third trimester Pt is s/p one dose of Venofer, has upcoming visits for  remaining doses Lab Results  Component Value Date   HGB 9.3 (L) 01/31/2023   3. Low-lying placenta Last U/S at [redacted]w[redacted]d - resolved   4. ASCUS with positive high risk HPV cervical Needs PP colposcopy  5. Family history of congenital heart disease Father born with PDA at 25 weeks Previously discussed with Dr. Judeth Cornfield, sent for fetal echo Plans on having fetal echo done soon  6. Interstitial cystitis Prev taking Uribel/Azo PRN, has not needed for most of pregnancy  Preterm labor symptoms and general obstetric precautions including but not limited to vaginal bleeding, contractions, leaking of fluid and fetal movement were reviewed in detail with the patient. Please refer to After Visit Summary for other counseling recommendations.   Return in about 2 weeks (around 02/25/2023) for Routine prenatal care.  Future Appointments  Date Time Provider Department Center  02/18/2023  9:30 AM [facility] None  02/27/2023 10:15 AM Federico Flake, MD [facility]    Sundra Aland, MD OB Fellow, Faculty Practice [facility], Center for [facility]

## 2023-02-11 NOTE — Progress Notes (Signed)
Diagnosis: Iron Deficiency Anemia  Provider:  Chilton Greathouse MD  Procedure: IV Infusion  IV Type: Peripheral, IV Location: L Antecubital  Venofer (Iron Sucrose), Dose: 300 mg  Infusion Start Time: 1040  Infusion Stop Time: 1226  Post Infusion IV Care: Observation period completed and Peripheral IV Discontinued  Discharge: Condition: Good, Destination: Home . AVS Provided  Performed by:  Adriana Mccallum, RN

## 2023-02-11 NOTE — Patient Instructions (Signed)
Iron Sucrose Injection What is this medication? IRON SUCROSE (EYE ern SOO krose) treats low levels of iron (iron deficiency anemia) in people with kidney disease. Iron is a mineral that plays an important role in making red blood cells, which carry oxygen from your lungs to the rest of your body. This medicine may be used for other purposes; ask your health care provider or pharmacist if you have questions. COMMON BRAND NAME(S): Venofer What should I tell my care team before I take this medication? They need to know if you have any of these conditions: Anemia not caused by low iron levels Heart disease High levels of iron in the blood Kidney disease Liver disease An unusual or allergic reaction to iron, other medications, foods, dyes, or preservatives Pregnant or trying to get pregnant Breastfeeding How should I use this medication? This medication is for infusion into a vein. It is given in a hospital or clinic setting. Talk to your care team about the use of this medication in children. While this medication may be prescribed for children as young as 2 years for selected conditions, precautions do apply. Overdosage: If you think you have taken too much of this medicine contact a poison control center or emergency room at once. NOTE: This medicine is only for you. Do not share this medicine with others. What if I miss a dose? Keep appointments for follow-up doses. It is important not to miss your dose. Call your care team if you are unable to keep an appointment. What may interact with this medication? Do not take this medication with any of the following: Deferoxamine Dimercaprol Other iron products This medication may also interact with the following: Chloramphenicol Deferasirox This list may not describe all possible interactions. Give your health care provider a list of all the medicines, herbs, non-prescription drugs, or dietary supplements you use. Also tell them if you smoke,  drink alcohol, or use illegal drugs. Some items may interact with your medicine. What should I watch for while using this medication? Visit your care team regularly. Tell your care team if your symptoms do not start to get better or if they get worse. You may need blood work done while you are taking this medication. You may need to follow a special diet. Talk to your care team. Foods that contain iron include: whole grains/cereals, dried fruits, beans, or peas, leafy green vegetables, and organ meats (liver, kidney). What side effects may I notice from receiving this medication? Side effects that you should report to your care team as soon as possible: Allergic reactions--skin rash, itching, hives, swelling of the face, lips, tongue, or throat Low blood pressure--dizziness, feeling faint or lightheaded, blurry vision Shortness of breath Side effects that usually do not require medical attention (report to your care team if they continue or are bothersome): Flushing Headache Joint pain Muscle pain Nausea Pain, redness, or irritation at injection site This list may not describe all possible side effects. Call your doctor for medical advice about side effects. You may report side effects to FDA at 1-800-FDA-1088. Where should I keep my medication? This medication is given in a hospital or clinic. It will not be stored at home. NOTE: This sheet is a summary. It may not cover all possible information. If you have questions about this medicine, talk to your doctor, pharmacist, or health care provider.  2024 Elsevier/Gold Standard (2022-11-30 00:00:00)

## 2023-02-12 ENCOUNTER — Other Ambulatory Visit (HOSPITAL_COMMUNITY): Payer: Self-pay

## 2023-02-13 ENCOUNTER — Encounter: Payer: Self-pay | Admitting: *Deleted

## 2023-02-18 ENCOUNTER — Ambulatory Visit (INDEPENDENT_AMBULATORY_CARE_PROVIDER_SITE_OTHER): Payer: Medicaid Other

## 2023-02-18 VITALS — BP 121/79 | HR 90 | Temp 98.0°F | Resp 16 | Ht 67.0 in | Wt 160.2 lb

## 2023-02-18 DIAGNOSIS — Z3A33 33 weeks gestation of pregnancy: Secondary | ICD-10-CM

## 2023-02-18 DIAGNOSIS — D508 Other iron deficiency anemias: Secondary | ICD-10-CM

## 2023-02-18 DIAGNOSIS — O99013 Anemia complicating pregnancy, third trimester: Secondary | ICD-10-CM

## 2023-02-18 MED ORDER — DIPHENHYDRAMINE HCL 25 MG PO CAPS
25.0000 mg | ORAL_CAPSULE | Freq: Once | ORAL | Status: AC
  Administered 2023-02-18: 25 mg via ORAL
  Filled 2023-02-18: qty 1

## 2023-02-18 MED ORDER — SODIUM CHLORIDE 0.9 % IV SOLN
300.0000 mg | INTRAVENOUS | Status: DC
  Administered 2023-02-18: 300 mg via INTRAVENOUS
  Filled 2023-02-18: qty 15

## 2023-02-18 MED ORDER — ACETAMINOPHEN 325 MG PO TABS
650.0000 mg | ORAL_TABLET | Freq: Once | ORAL | Status: AC
  Administered 2023-02-18: 650 mg via ORAL
  Filled 2023-02-18: qty 2

## 2023-02-18 NOTE — Progress Notes (Signed)
Diagnosis: Acute Anemia  Provider:  Chilton Greathouse MD  Procedure: IV Infusion  IV Type: Peripheral, IV Location: L Antecubital  Venofer (Iron Sucrose), Dose: 300 mg  Infusion Start Time: 1026  Infusion Stop Time: 1205  Post Infusion IV Care: Patient declined observation and Peripheral IV Discontinued  Discharge: Condition: Stable, Destination: Home . AVS Declined  Performed by:  Wyvonne Lenz, RN

## 2023-02-27 ENCOUNTER — Ambulatory Visit (INDEPENDENT_AMBULATORY_CARE_PROVIDER_SITE_OTHER): Payer: Medicaid Other | Admitting: Family Medicine

## 2023-02-27 ENCOUNTER — Other Ambulatory Visit: Payer: Self-pay

## 2023-02-27 ENCOUNTER — Other Ambulatory Visit (HOSPITAL_COMMUNITY): Payer: Self-pay

## 2023-02-27 VITALS — BP 125/82 | HR 80 | Wt 165.0 lb

## 2023-02-27 DIAGNOSIS — O4443 Low lying placenta NOS or without hemorrhage, third trimester: Secondary | ICD-10-CM

## 2023-02-27 DIAGNOSIS — Z3493 Encounter for supervision of normal pregnancy, unspecified, third trimester: Secondary | ICD-10-CM

## 2023-02-27 DIAGNOSIS — Z8279 Family history of other congenital malformations, deformations and chromosomal abnormalities: Secondary | ICD-10-CM

## 2023-02-27 DIAGNOSIS — O4440 Low lying placenta NOS or without hemorrhage, unspecified trimester: Secondary | ICD-10-CM

## 2023-02-27 DIAGNOSIS — Z3A34 34 weeks gestation of pregnancy: Secondary | ICD-10-CM

## 2023-02-27 DIAGNOSIS — O99013 Anemia complicating pregnancy, third trimester: Secondary | ICD-10-CM

## 2023-02-27 MED ORDER — FAMOTIDINE 20 MG PO TABS
20.0000 mg | ORAL_TABLET | Freq: Two times a day (BID) | ORAL | 2 refills | Status: DC
Start: 2023-02-27 — End: 2023-03-19
  Filled 2023-02-27: qty 60, 30d supply, fill #0

## 2023-02-27 NOTE — Progress Notes (Signed)
PRENATAL VISIT NOTE  Subjective:  Courtney Huang is a 27 y.o. G1P0 at 104w5d being seen today for ongoing prenatal care.  She is currently monitored for the following issues for this low-risk pregnancy and has IC (interstitial cystitis); Supervision of low-risk pregnancy; Family history of congenital heart disease; ASCUS with positive high risk HPV cervical; Low-lying placenta; and Anemia affecting pregnancy in third trimester on their problem list.  Patient reports acid reflux including vomiting of stomach acid for the past two weeks about 1-2 times per day, that occasionally wakes her up at night and accompanying right-sided temporal and neck pain. She reports taking Tums daily with little relief. She reports that she had heartburn during the second trimester. Contractions: Irritability. Vag. Bleeding: None.  Movement: Present. Denies leaking of fluid. Patient reports Braxton-Hicks-like contractions for about 6 weeks, about once daily.  The following portions of the patient's history were reviewed and updated as appropriate: allergies, current medications, past family history, past medical history, past social history, past surgical history and problem list.   Objective:   Vitals:   02/27/23 1100  BP: 125/82  Pulse: 80  Weight: 165 lb (74.8 kg)    Fetal Status: Fetal Heart Rate (bpm): 145 Fundal Height: 34 cm Movement: Present     General:  Alert, oriented and cooperative. Patient is in no acute distress.  Skin: Skin is warm and dry. No rash noted.   Cardiovascular: Normal heart rate noted  Respiratory: Normal respiratory effort, no problems with respiration noted  Abdomen: Soft, gravid, appropriate for gestational age.  Pain/Pressure: Present     Pelvic: Cervical exam deferred        Extremities: Normal range of motion.     Mental Status: Normal mood and affect. Normal behavior. Normal judgment and thought content.   Assessment and Plan:  Pregnancy: G1P0 at [redacted]w[redacted]d 1. Anemia  affecting pregnancy in third trimester Patient had second iron infusion on 8/12 Check Hgb again at 36w.  2. Family history of congenital heart disease Patient reports discussing the baby's father's history of PDA at 25 weeks with Dr. Crissie Reese and declining echo  3. Encounter for supervision of low-risk pregnancy in third trimester BP and FHR normal FH normal at 34 cm  4. Low-lying placenta [place] at [redacted]w[redacted]d indicates resolved  5. Acid reflux Given 2-week+ history of acid reflux, prescribed 20 mg famotidine BID and discussed other measures to reduce symptoms including raising head of bed, eating smaller meals more frequently, and continuing to take Tums in addition to famotidine  Preterm labor symptoms and general obstetric precautions including but not limited to vaginal bleeding, contractions, leaking of fluid and fetal movement were reviewed in detail with the patient. Please refer to After Visit Summary for other counseling recommendations.   No follow-ups on file.  Future Appointments  Date Time Provider Department Center  03/15/2023  9:35 AM Sue Lush, FNP [facility]  03/22/2023 10:15 AM Venora Maples, MD [facility]  03/29/2023 10:15 AM Venora Maples, MD [facility]  04/08/2023 10:55 AM Federico Flake, MD [facility]  04/08/2023 11:15 AM [facility] US2 [facility]    [place] E Swandell, Medical Student   Attestation of Supervision of Student:  I confirm that I have verified the information documented in the medical student's note and that I have also personally performed the history, physical exam and all medical decision making activities.  I have verified that all services and findings are accurately documented in this student's note;  and I agree with management and plan as outlined in the documentation. I have also made any necessary editorial changes.   Federico Flake, MD Center for [facility], [facility] 02/27/2023 1:45 PM

## 2023-03-12 ENCOUNTER — Other Ambulatory Visit (HOSPITAL_COMMUNITY): Payer: Self-pay

## 2023-03-15 ENCOUNTER — Other Ambulatory Visit (HOSPITAL_COMMUNITY)
Admission: RE | Admit: 2023-03-15 | Discharge: 2023-03-15 | Disposition: A | Discharge status: Home / Self Care | Payer: Medicaid Other | Source: Ambulatory Visit | Attending: Obstetrics and Gynecology | Admitting: Obstetrics and Gynecology

## 2023-03-15 ENCOUNTER — Ambulatory Visit (INDEPENDENT_AMBULATORY_CARE_PROVIDER_SITE_OTHER): Payer: Medicaid Other | Admitting: Obstetrics and Gynecology

## 2023-03-15 ENCOUNTER — Encounter: Payer: Self-pay | Admitting: Obstetrics and Gynecology

## 2023-03-15 VITALS — BP 144/96 | HR 131 | Wt 168.0 lb

## 2023-03-15 DIAGNOSIS — R030 Elevated blood-pressure reading, without diagnosis of hypertension: Secondary | ICD-10-CM

## 2023-03-15 DIAGNOSIS — Z3493 Encounter for supervision of normal pregnancy, unspecified, third trimester: Secondary | ICD-10-CM | POA: Insufficient documentation

## 2023-03-15 DIAGNOSIS — O99013 Anemia complicating pregnancy, third trimester: Secondary | ICD-10-CM

## 2023-03-15 DIAGNOSIS — Z3A37 37 weeks gestation of pregnancy: Secondary | ICD-10-CM | POA: Insufficient documentation

## 2023-03-15 MED ORDER — BLOOD PRESSURE KIT DEVI
1.0000 | 0 refills | Status: DC | PRN
Start: 2023-03-15 — End: 2023-04-16

## 2023-03-15 NOTE — Patient Instructions (Signed)
Check BP at home  Go to hospital if elevated blood pressures above 140/90, and/or headaches unrelieved by tylenol, vision changes, or right upper quadrant pain

## 2023-03-15 NOTE — Progress Notes (Signed)
   PRENATAL VISIT NOTE  Subjective:  Courtney Huang is a 27 y.o. G1P0 at [redacted]w[redacted]d being seen today for ongoing prenatal care.  She is currently monitored for the following issues for this low-risk pregnancy and has IC (interstitial cystitis); Supervision of low-risk pregnancy; Family history of congenital heart disease; ASCUS with positive high risk HPV cervical; Low-lying placenta; and Anemia affecting pregnancy in third trimester on their problem list.  Patient reports  cramps and braxton hicks  .  Contractions: Irritability. Vag. Bleeding: None.  Movement: Present. Denies leaking of fluid.   The following portions of the patient's history were reviewed and updated as appropriate: allergies, current medications, past family history, past medical history, past social history, past surgical history and problem list.   Objective:   Vitals:   03/15/23 1009 03/15/23 1038  BP: (!) 143/102 (!) 144/96  Pulse: (!) 131 (!) 131  Weight: 168 lb (76.2 kg)     Fetal Status: Fetal Heart Rate (bpm): 141 Fundal Height: 37 cm Movement: Present  Presentation: Vertex  General:  Alert, oriented and cooperative. Patient is in no acute distress.  Skin: Skin is warm and dry. No rash noted.   Cardiovascular: Normal heart rate noted  Respiratory: Normal respiratory effort, no problems with respiration noted  Abdomen: Soft, gravid, appropriate for gestational age.  Pain/Pressure: Present     Pelvic: Cervical exam performed in the presence of a chaperone Dilation: Fingertip Effacement (%): Thick Station: -3  Extremities: Normal range of motion.  Edema: None  Mental Status: Normal mood and affect. Normal behavior. Normal judgment and thought content.   Assessment and Plan:  Pregnancy: G1P0 at [redacted]w[redacted]d 1. Encounter for supervision of low-risk pregnancy in third trimester FHR and FH normal Feeling regular fetal movement Labor precautions discussed   2. Anemia affecting pregnancy in third trimester 7/25 hgb  9.3, s/p iv iron checking blood work today   3. [redacted] weeks gestation of pregnancy Swabs collected today - Culture, beta strep (group b only) - GC/Chlamydia probe amp ([place])not at [facility]  4. Elevated BP without diagnosis of hypertension Elevated BP today and on repeat. Chart review, this is first occurrence in pregnancy. No pec s&s. Encouraged to check bp at home.Strict precautions to go to MAU for elevated home BP and/or pec s&s. D/w Dr. Shawnie Pons in office Will come back Monday for BP check and consider iol ghtn if elevated Monday  - CBC - Comp Met (CMET) - Protein / creatinine ratio, urine - Blood Pressure Monitoring (BLOOD PRESSURE KIT) DEVI; 1 Device by Does not apply route as needed.  Dispense: 1 each; Refill: 0  Term labor symptoms and general obstetric precautions including but not limited to vaginal bleeding, contractions, leaking of fluid and fetal movement were reviewed in detail with the patient. Please refer to After Visit Summary for other counseling recommendations.   Return in about 3 days (around 03/18/2023), or Return on monday 9/9 for nurse visist BP check.  Future Appointments  Date Time Provider Department Center  03/22/2023 10:15 AM Venora Maples, MD [facility]  03/29/2023 10:15 AM Venora Maples, MD [facility]  04/08/2023 10:55 AM Federico Flake, MD [facility]  04/08/2023 11:15 AM [facility] US2 [facility]    Albertine Grates, FNP

## 2023-03-16 ENCOUNTER — Inpatient Hospital Stay (HOSPITAL_COMMUNITY)
Admission: AD | Admit: 2023-03-16 | Discharge: 2023-03-19 | DRG: 807 | Disposition: A | Discharge status: Home / Self Care | Payer: Medicaid Other | Attending: Obstetrics & Gynecology | Admitting: Obstetrics & Gynecology

## 2023-03-16 ENCOUNTER — Other Ambulatory Visit: Payer: Self-pay

## 2023-03-16 ENCOUNTER — Encounter (HOSPITAL_COMMUNITY): Payer: Self-pay | Admitting: Obstetrics & Gynecology

## 2023-03-16 DIAGNOSIS — Z3A37 37 weeks gestation of pregnancy: Secondary | ICD-10-CM

## 2023-03-16 DIAGNOSIS — Z3493 Encounter for supervision of normal pregnancy, unspecified, third trimester: Principal | ICD-10-CM

## 2023-03-16 DIAGNOSIS — O4440 Low lying placenta NOS or without hemorrhage, unspecified trimester: Secondary | ICD-10-CM

## 2023-03-16 DIAGNOSIS — O1493 Unspecified pre-eclampsia, third trimester: Secondary | ICD-10-CM | POA: Diagnosis present

## 2023-03-16 DIAGNOSIS — O9902 Anemia complicating childbirth: Secondary | ICD-10-CM | POA: Diagnosis present

## 2023-03-16 DIAGNOSIS — O99013 Anemia complicating pregnancy, third trimester: Secondary | ICD-10-CM

## 2023-03-16 DIAGNOSIS — Z8759 Personal history of other complications of pregnancy, childbirth and the puerperium: Secondary | ICD-10-CM | POA: Diagnosis present

## 2023-03-16 DIAGNOSIS — O1404 Mild to moderate pre-eclampsia, complicating childbirth: Principal | ICD-10-CM | POA: Diagnosis present

## 2023-03-16 HISTORY — DX: Interstitial cystitis (chronic) without hematuria: N30.10

## 2023-03-16 LAB — COMPREHENSIVE METABOLIC PANEL
ALT: 14 IU/L (ref 0–32)
ALT: 20 U/L (ref 0–44)
AST: 20 IU/L (ref 0–40)
AST: 26 U/L (ref 15–41)
Albumin: 3.5 g/dL — ABNORMAL LOW (ref 4.0–5.0)
Albumin: 2.9 g/dL — ABNORMAL LOW (ref 3.5–5.0)
Alkaline Phosphatase: 115 IU/L (ref 44–121)
Alkaline Phosphatase: 103 U/L (ref 38–126)
Anion gap: 12 (ref 5–15)
BUN/Creatinine Ratio: 8 — ABNORMAL LOW (ref 9–23)
BUN: 5 mg/dL — ABNORMAL LOW (ref 6–20)
BUN: 7 mg/dL (ref 6–20)
Bilirubin Total: 0.2 mg/dL (ref 0.0–1.2)
CO2: 21 mmol/L (ref 20–29)
CO2: 20 mmol/L — ABNORMAL LOW (ref 22–32)
Calcium: 9.6 mg/dL (ref 8.7–10.2)
Calcium: 9.4 mg/dL (ref 8.9–10.3)
Chloride: 102 mmol/L (ref 96–106)
Chloride: 103 mmol/L (ref 98–111)
Creatinine, Ser: 0.62 mg/dL (ref 0.57–1.00)
Creatinine, Ser: 0.69 mg/dL (ref 0.44–1.00)
GFR, Estimated: >60 mL/min (ref >60)
Globulin, Total: 2.7 g/dL (ref 1.5–4.5)
Glucose, Bld: 77 mg/dL (ref 70–99)
Glucose: 70 mg/dL (ref 70–99)
Potassium: 4 mmol/L (ref 3.5–5.2)
Potassium: 3.8 mmol/L (ref 3.5–5.1)
Sodium: 137 mmol/L (ref 134–144)
Sodium: 135 mmol/L (ref 135–145)
Total Bilirubin: 0.5 mg/dL (ref 0.3–1.2)
Total Protein: 6.2 g/dL (ref 6.0–8.5)
Total Protein: 6.3 g/dL — ABNORMAL LOW (ref 6.5–8.1)
eGFR: 125 mL/min/{1.73_m2} (ref >59)

## 2023-03-16 LAB — CBC
HCT: 34.6 % — ABNORMAL LOW (ref 36.0–46.0)
Hematocrit: 34.4 % (ref 34.0–46.6)
Hemoglobin: 11.5 g/dL (ref 11.1–15.9)
Hemoglobin: 11.1 g/dL — ABNORMAL LOW (ref 12.0–15.0)
MCH: 27.1 pg (ref 26.6–33.0)
MCH: 26.4 pg (ref 26.0–34.0)
MCHC: 33.4 g/dL (ref 31.5–35.7)
MCHC: 32.1 g/dL (ref 30.0–36.0)
MCV: 81 fL (ref 79–97)
MCV: 82.2 fL (ref 80.0–100.0)
Platelets: 169 10*3/uL (ref 150–450)
Platelets: 191 10*3/uL (ref 150–400)
RBC: 4.24 x10E6/uL (ref 3.77–5.28)
RBC: 4.21 MIL/uL (ref 3.87–5.11)
RDW: 21.4 % — ABNORMAL HIGH (ref 11.7–15.4)
RDW: 20.7 % — ABNORMAL HIGH (ref 11.5–15.5)
WBC: 8.1 10*3/uL (ref 3.4–10.8)
WBC: 9.1 10*3/uL (ref 4.0–10.5)
nRBC: 0 % (ref 0.0–0.2)

## 2023-03-16 LAB — TYPE AND SCREEN
ABO/RH(D): B POS
Antibody Screen: NEGATIVE

## 2023-03-16 LAB — GROUP B STREP BY PCR: Group B strep by PCR: NEGATIVE

## 2023-03-16 LAB — PROTEIN / CREATININE RATIO, URINE
Creatinine, Urine: 77.7 mg/dL
Protein, Ur: 57.9 mg/dL
Protein/Creat Ratio: 745 mg/g{creat} — ABNORMAL HIGH (ref 0–200)

## 2023-03-16 MED ORDER — OXYTOCIN-SODIUM CHLORIDE 30-0.9 UT/500ML-% IV SOLN
2.5000 [IU]/h | INTRAVENOUS | Status: DC
  Filled 2023-03-16: qty 500

## 2023-03-16 MED ORDER — LACTATED RINGERS IV SOLN: INTRAVENOUS | Status: DC

## 2023-03-16 MED ORDER — TERBUTALINE SULFATE 1 MG/ML IJ SOLN: 0.2500 mg | Freq: Once | INTRAMUSCULAR | Status: DC | PRN

## 2023-03-16 MED ORDER — LIDOCAINE HCL (PF) 1 % IJ SOLN
30.0000 mL | INTRAMUSCULAR | Status: AC | PRN
  Administered 2023-03-17: 30 mL via SUBCUTANEOUS
  Filled 2023-03-16: qty 30

## 2023-03-16 MED ORDER — ONDANSETRON HCL 4 MG/2ML IJ SOLN: 4.0000 mg | Freq: Four times a day (QID) | INTRAMUSCULAR | Status: DC | PRN

## 2023-03-16 MED ORDER — MISOPROSTOL 25 MCG QUARTER TABLET
25.0000 ug | ORAL_TABLET | Freq: Once | ORAL | Status: AC
  Administered 2023-03-16: 25 ug via ORAL
  Filled 2023-03-16: qty 1

## 2023-03-16 MED ORDER — SOD CITRATE-CITRIC ACID 500-334 MG/5ML PO SOLN: 30.0000 mL | ORAL | Status: DC | PRN

## 2023-03-16 MED ORDER — LACTATED RINGERS IV SOLN
500.0000 mL | INTRAVENOUS | Status: DC | PRN
  Administered 2023-03-16: 500 mL via INTRAVENOUS

## 2023-03-16 MED ORDER — MAGNESIUM SULFATE 40 GM/1000ML IV SOLN: 1.0000 g/h | INTRAVENOUS | Status: DC

## 2023-03-16 MED ORDER — OXYCODONE-ACETAMINOPHEN 5-325 MG PO TABS: 1.0000 | ORAL_TABLET | ORAL | Status: DC | PRN

## 2023-03-16 MED ORDER — MISOPROSTOL 50MCG HALF TABLET
50.0000 ug | ORAL_TABLET | Freq: Once | ORAL | Status: DC
  Filled 2023-03-16: qty 1

## 2023-03-16 MED ORDER — OXYTOCIN-SODIUM CHLORIDE 30-0.9 UT/500ML-% IV SOLN: 1.0000 m[IU]/min | INTRAVENOUS | Status: DC

## 2023-03-16 MED ORDER — MAGNESIUM SULFATE BOLUS VIA INFUSION
4.0000 g | Freq: Once | INTRAVENOUS | Status: DC
  Filled 2023-03-16: qty 1000

## 2023-03-16 MED ORDER — FLEET ENEMA RE ENEM: 1.0000 | ENEMA | RECTAL | Status: DC | PRN

## 2023-03-16 MED ORDER — OXYCODONE-ACETAMINOPHEN 5-325 MG PO TABS: 2.0000 | ORAL_TABLET | ORAL | Status: DC | PRN

## 2023-03-16 MED ORDER — OXYTOCIN BOLUS FROM INFUSION
333.0000 mL | Freq: Once | INTRAVENOUS | Status: AC
  Administered 2023-03-17: 333 mL via INTRAVENOUS

## 2023-03-16 MED ORDER — ACETAMINOPHEN 325 MG PO TABS: 650.0000 mg | ORAL_TABLET | ORAL | Status: DC | PRN

## 2023-03-16 NOTE — Progress Notes (Signed)
Courtney Huang is a 27 y.o. G1P0 at [redacted]w[redacted]d by LMP admitted for induction of labor due to preE without severe features.  Subjective: Pt is observed resting in bed, family at bedside. She has started to feel irregular contractions in the last 20 minutes. She denies headache, vision changes, SOB, CP, abdominal pain. Agreeable to begin IOL.   Objective: BP (!) 144/91   Pulse 84   Temp 98 F (36.7 C) (Oral)   Resp 15   Ht 5\' 6"  (1.676 m)   Wt 76.7 kg   LMP 07/05/2022   SpO2 100%   BMI 27.28 kg/m  No intake/output data recorded. No intake/output data recorded.  FHT:  FHR: 145 bpm, variability: moderate,  accelerations:  Present,  decelerations:  Absent UC:   irregular SVE:   Exam by:: Rayfield Citizen Neill,CNM  Labs: Lab Results  Component Value Date   WBC 9.1 03/16/2023   HGB 11.1 (L) 03/16/2023   HCT 34.6 (L) 03/16/2023   MCV 82.2 03/16/2023   PLT 191 03/16/2023    Assessment / Plan: Induction of labor due to preeclampsia,  progressing well on pitocin  Labor: Progressing on Pitocin, will continue to increase then AROM Preeclampsia:  labs stable, on magnesium Fetal Wellbeing:  Category I Pain Control:  IV pain meds I/D:   GBS status unknown, PCN prophylaxis not indicated per ACOG guidelines Anticipated MOD:  NSVD  Landry Dyke, MD 03/16/2023, 7:30 PM

## 2023-03-16 NOTE — MAU Note (Signed)
Courtney Huang is a 27 y.o. at [redacted]w[redacted]d here in MAU reporting: here for BP evaluation, states she's been monitoring BP's @ home and BP's today have been 147/94, 148/92, and 144/95.  Denies HA, visual disturbances, and epigastric pain.  Denies VB or LOF.  Reports +FM. LMP: NA Onset of complaint: today Pain score: 4 Vitals:   03/16/23 1716  BP: (!) 133/92  Pulse: 91  Resp: 19  Temp: 98.1 F (36.7 C)  SpO2: 100%     FHT:141 bpm Lab orders placed from triage:   UA

## 2023-03-16 NOTE — H&P (Signed)
OBSTETRIC ADMISSION HISTORY AND PHYSICAL  Daily Courtney Huang is a 27 y.o. female G1P0 with IUP at [redacted]w[redacted]d by LMP presenting for IOL for preeclampsia without severe features. She reports +FMs, No LOF, no VB, no blurry vision, headaches or peripheral edema, and RUQ pain.  She plans on breast and bottle feeding. She request POP for birth control. She received her prenatal care at  [facility]    Dating: By LMP --->  Estimated Date of Delivery: 04/05/23  Nursing Staff Provider  Office Location [facility] Dating  Last Menstrual Period /10wk [place] is consistent   [facility] Model [ ]  Traditional [ ]  Centering [x]  Mom-Baby Dyad    Language  English Anatomy US  Normal anatomy, low lying placenta 1.1 cm  Flu Vaccine  Declined  Genetic/Carrier Screen  NIPS:   low risk AFP:   declined Horizon: neg 4/4  TDaP Vaccine   01/02/23 Hgb A1C or  GTT Early  Third trimester - normal  COVID Vaccine none   LAB RESULTS   Rhogam  N/a Blood Type B/Positive/-- (02/28 1522)   Baby Feeding Plan both Antibody Negative (02/28 1522)  Contraception ? POP ? Depo Rubella 3.87 (02/28 1522)  Circumcision If boy- Yes RPR Non Reactive (02/28 1522)   Pediatrician  MBCC HBsAg Negative (02/28 1522)   Support Person Ivar Drape- FOB HCVAb Non Reactive (02/28 1522)   Prenatal Classes  HIV Non Reactive (02/28 1522)     BTL Consent  GBS   (For PCN allergy, check sensitivities)   VBAC Consent  Pap Diagnosis  Date Value Ref Range Status  09/05/2022 (A)  Final   - Atypical squamous cells of undetermined significance (ASC-US)         DME Rx [ ]  BP cuff [ ]  Weight Scale Waterbirth  [ ]  Class [ ]  Consent [ ]  CNM visit  PHQ9 & GAD7 [  x] new OB [ x ] 28 weeks  [  ] 36 weeks Induction  [ ]  Orders Entered [ ] Foley Y/N   Prenatal History/Complications: preeclampsia without severe features  Past Medical History: Past Medical History:  Diagnosis Date   IC (interstitial cystitis)     Past Surgical History: Past Surgical History:   Procedure Laterality Date   NO PAST SURGERIES      Obstetrical History: OB History     Gravida  1   Para      Term      Preterm      AB      Living         SAB      IAB      Ectopic      Multiple      Live Births              Social History Social History   Socioeconomic History   Marital status: Single    Spouse name: Not on file   Number of children: Not on file   Years of education: Not on file   Highest education level: Not on file  Occupational History   Not on file  Tobacco Use   Smoking status: Never   Smokeless tobacco: Never  Vaping Use   Vaping status: Former   Quit date: 12/07/2021  Substance and Sexual Activity   Alcohol use: Not Currently   Drug use: Never   Sexual activity: Yes  Other Topics Concern   Not on file  Social History Narrative   Not on file  Social Determinants of Health   Financial Resource Strain: Not on file  Food Insecurity: Not on file  Transportation Needs: Not on file  Physical Activity: Not on file  Stress: Not on file  Social Connections: Unknown (11/21/2021)   Received from [facility]   Social Network    Social Network: Not on file    Family History: Family History  Family history unknown: Yes    Allergies: Allergies  Allergen Reactions   Other Hives    Allergy to certain candies, perfume     Medications Prior to Admission  Medication Sig Dispense Refill Last Dose   calcium carbonate (TUMS - DOSED IN MG ELEMENTAL CALCIUM) 500 MG chewable tablet Chew 2 tablets by mouth daily.   03/16/2023   Prenatal Vit-Fe Fumarate-FA (MULTIVITAMIN-PRENATAL) 27-0.8 MG TABS tablet Take 1 tablet by mouth daily at 12 noon.   03/15/2023   Blood Pressure Monitoring (BLOOD PRESSURE KIT) DEVI 1 Device by Does not apply route as needed. 1 each 0    famotidine (PEPCID) 20 MG tablet Take 1 tablet (20 mg total) by mouth 2 (two) times daily. 60 tablet 2      Review of Systems   All systems reviewed  and negative except as stated in HPI  Blood pressure 134/88, pulse 82, temperature 98.1 F (36.7 C), temperature source Oral, resp. rate 19, height 5\' 6"  (1.676 m), weight 76.7 kg, last menstrual period 07/05/2022, SpO2 100%. General appearance: alert, cooperative, and no distress Lungs: clear to auscultation bilaterally Heart: regular rate and rhythm Abdomen: soft, non-tender; bowel sounds normal Pelvic: n/a Extremities: Homans sign is negative, no sign of DVT DTR's +2 Fetal monitoringBaseline: 130 bpm, Variability: Good {> 6 bpm), Accelerations: Reactive, and Decelerations: Absent Uterine activityFrequency: Every 4-6 minutes     Prenatal labs: ABO, Rh: B/Positive/-- (02/28 1522) Antibody: Negative (02/28 1522) Rubella: 3.87 (02/28 1522) RPR: Non Reactive (06/26 0824)  HBsAg: Negative (02/28 1522)  HIV: Non Reactive (06/26 0824)  GBS:   pending  Prenatal Transfer Tool  Maternal Diabetes: No Genetic Screening: Normal Maternal Ultrasounds/Referrals: Normal Fetal Ultrasounds or other Referrals:  None Maternal Substance Abuse:  No Significant Maternal Medications:  None Significant Maternal Lab Results:  None Number of Prenatal Visits:greater than 3 verified prenatal visits Other Comments:  None  No results found for this or any previous visit (from the past 24 hour(s)).  Patient Active Problem List   Diagnosis Date Noted   Anemia affecting pregnancy in third trimester 01/14/2023   Low-lying placenta 11/15/2022   ASCUS with positive high risk HPV cervical 09/12/2022   Family history of congenital heart disease 09/05/2022   Supervision of low-risk pregnancy 09/04/2022   IC (interstitial cystitis) 06/03/2017    Assessment/Plan:  Courtney Huang is a 27 y.o. G1P0 at [redacted]w[redacted]d here for IOL for preeclampsia without severe features  #Labor: lengthy discussion of IOL options including cytotec and FB in the early stage. Will determine method after exam on labor  #Pain: Per  patient request #FWB: Cat 1 #ID:  GBS pending #MOF: both #MOC: POP #Circ:  yes  Rolm Bookbinder, CNM  03/16/2023, 5:59 PM

## 2023-03-17 ENCOUNTER — Encounter (HOSPITAL_COMMUNITY): Payer: Self-pay | Admitting: Obstetrics & Gynecology

## 2023-03-17 DIAGNOSIS — Z3A37 37 weeks gestation of pregnancy: Secondary | ICD-10-CM

## 2023-03-17 DIAGNOSIS — O1404 Mild to moderate pre-eclampsia, complicating childbirth: Secondary | ICD-10-CM

## 2023-03-17 LAB — CBC
HCT: 33.7 % — ABNORMAL LOW (ref 36.0–46.0)
Hemoglobin: 10.6 g/dL — ABNORMAL LOW (ref 12.0–15.0)
MCH: 26.3 pg (ref 26.0–34.0)
MCHC: 31.5 g/dL (ref 30.0–36.0)
MCV: 83.6 fL (ref 80.0–100.0)
Platelets: 158 10*3/uL (ref 150–400)
RBC: 4.03 MIL/uL (ref 3.87–5.11)
RDW: 20.7 % — ABNORMAL HIGH (ref 11.5–15.5)
WBC: 12.4 10*3/uL — ABNORMAL HIGH (ref 4.0–10.5)
nRBC: 0 % (ref 0.0–0.2)

## 2023-03-17 LAB — RPR: RPR Ser Ql: NONREACTIVE

## 2023-03-17 MED ORDER — MISOPROSTOL 200 MCG PO TABS
ORAL_TABLET | ORAL | Status: AC
  Filled 2023-03-17: qty 4

## 2023-03-17 MED ORDER — SODIUM CHLORIDE 0.9% FLUSH
3.0000 mL | Freq: Two times a day (BID) | INTRAVENOUS | Status: DC
  Administered 2023-03-18: 3 mL via INTRAVENOUS

## 2023-03-17 MED ORDER — EPHEDRINE 5 MG/ML INJ: 10.0000 mg | INTRAVENOUS | Status: DC | PRN

## 2023-03-17 MED ORDER — TRANEXAMIC ACID-NACL 1000-0.7 MG/100ML-% IV SOLN
INTRAVENOUS | Status: AC
  Filled 2023-03-17: qty 100

## 2023-03-17 MED ORDER — BENZOCAINE-MENTHOL 20-0.5 % EX AERO
1.0000 | INHALATION_SPRAY | CUTANEOUS | Status: DC | PRN
  Administered 2023-03-17: 1 via TOPICAL
  Filled 2023-03-17: qty 56

## 2023-03-17 MED ORDER — SIMETHICONE 80 MG PO CHEW: 80.0000 mg | CHEWABLE_TABLET | ORAL | Status: DC | PRN

## 2023-03-17 MED ORDER — ONDANSETRON HCL 4 MG/2ML IJ SOLN: 4.0000 mg | INTRAMUSCULAR | Status: DC | PRN

## 2023-03-17 MED ORDER — FENTANYL CITRATE (PF) 100 MCG/2ML IJ SOLN
100.0000 ug | INTRAMUSCULAR | Status: DC | PRN
  Administered 2023-03-17: 100 ug via INTRAVENOUS
  Filled 2023-03-17: qty 2

## 2023-03-17 MED ORDER — LACTATED RINGERS IV SOLN: 500.0000 mL | Freq: Once | INTRAVENOUS | Status: DC

## 2023-03-17 MED ORDER — SENNOSIDES-DOCUSATE SODIUM 8.6-50 MG PO TABS
2.0000 | ORAL_TABLET | ORAL | Status: DC
  Administered 2023-03-18 – 2023-03-19 (×2): 2 via ORAL
  Filled 2023-03-17 (×2): qty 2

## 2023-03-17 MED ORDER — DIBUCAINE (PERIANAL) 1 % EX OINT: 1.0000 | TOPICAL_OINTMENT | CUTANEOUS | Status: DC | PRN

## 2023-03-17 MED ORDER — IBUPROFEN 600 MG PO TABS
600.0000 mg | ORAL_TABLET | Freq: Four times a day (QID) | ORAL | Status: DC
  Administered 2023-03-17 – 2023-03-19 (×9): 600 mg via ORAL
  Filled 2023-03-17 (×10): qty 1

## 2023-03-17 MED ORDER — CARBOPROST TROMETHAMINE 250 MCG/ML IM SOLN
INTRAMUSCULAR | Status: AC
  Filled 2023-03-17: qty 1

## 2023-03-17 MED ORDER — SODIUM CHLORIDE 0.9 % IV SOLN: 250.0000 mL | INTRAVENOUS | Status: DC | PRN

## 2023-03-17 MED ORDER — PHENYLEPHRINE 80 MCG/ML (10ML) SYRINGE FOR IV PUSH (FOR BLOOD PRESSURE SUPPORT): 80.0000 ug | PREFILLED_SYRINGE | INTRAVENOUS | Status: DC | PRN

## 2023-03-17 MED ORDER — MEDROXYPROGESTERONE ACETATE 150 MG/ML IM SUSP: 150.0000 mg | INTRAMUSCULAR | Status: DC | PRN

## 2023-03-17 MED ORDER — MISOPROSTOL 200 MCG PO TABS
800.0000 ug | ORAL_TABLET | Freq: Once | ORAL | Status: AC
  Administered 2023-03-17: 800 ug via RECTAL

## 2023-03-17 MED ORDER — ONDANSETRON HCL 4 MG PO TABS: 4.0000 mg | ORAL_TABLET | ORAL | Status: DC | PRN

## 2023-03-17 MED ORDER — SODIUM CHLORIDE 0.9% FLUSH: 3.0000 mL | INTRAVENOUS | Status: DC | PRN

## 2023-03-17 MED ORDER — COCONUT OIL OIL
1.0000 | TOPICAL_OIL | Status: DC | PRN
  Administered 2023-03-18: 1 via TOPICAL

## 2023-03-17 MED ORDER — DIPHENHYDRAMINE HCL 25 MG PO CAPS: 25.0000 mg | ORAL_CAPSULE | Freq: Four times a day (QID) | ORAL | Status: DC | PRN

## 2023-03-17 MED ORDER — DIPHENOXYLATE-ATROPINE 2.5-0.025 MG PO TABS
2.0000 | ORAL_TABLET | Freq: Four times a day (QID) | ORAL | Status: DC | PRN
  Administered 2023-03-17: 2 via ORAL
  Filled 2023-03-17: qty 2

## 2023-03-17 MED ORDER — WITCH HAZEL-GLYCERIN EX PADS
1.0000 | MEDICATED_PAD | CUTANEOUS | Status: DC | PRN
  Administered 2023-03-17: 1 via TOPICAL

## 2023-03-17 MED ORDER — PRENATAL MULTIVITAMIN CH
1.0000 | ORAL_TABLET | Freq: Every day | ORAL | Status: DC
  Administered 2023-03-17 – 2023-03-19 (×3): 1 via ORAL
  Filled 2023-03-17 (×3): qty 1

## 2023-03-17 MED ORDER — CARBOPROST TROMETHAMINE 250 MCG/ML IM SOLN
250.0000 ug | INTRAMUSCULAR | Status: DC | PRN
  Administered 2023-03-17: 250 ug via INTRAMUSCULAR

## 2023-03-17 MED ORDER — ACETAMINOPHEN 325 MG PO TABS
650.0000 mg | ORAL_TABLET | ORAL | Status: DC | PRN
  Administered 2023-03-19: 650 mg via ORAL
  Filled 2023-03-17: qty 2

## 2023-03-17 MED ORDER — DIPHENHYDRAMINE HCL 50 MG/ML IJ SOLN: 12.5000 mg | INTRAMUSCULAR | Status: DC | PRN

## 2023-03-17 MED ORDER — TRANEXAMIC ACID-NACL 1000-0.7 MG/100ML-% IV SOLN
1000.0000 mg | INTRAVENOUS | Status: AC
  Administered 2023-03-17: 1000 mg via INTRAVENOUS

## 2023-03-17 MED ORDER — FENTANYL-BUPIVACAINE-NACL 0.5-0.125-0.9 MG/250ML-% EP SOLN: 12.0000 mL/h | EPIDURAL | Status: DC | PRN

## 2023-03-17 NOTE — Progress Notes (Signed)
Labor Progress Note Katiejo Lova Maile is a 27 y.o. G1P0 at [redacted]w[redacted]d presented for IOL 2/2 pre-e w/o SF  S: Pt denies any sxs currently - specifically no HA, vision changes, SOB, RUQ pain, edema.   O:  BP (!) 142/91   Pulse 86   Temp 98.4 F (36.9 C) (Oral)   Resp 18   Ht 5\' 6"  (1.676 m)   Wt 76.7 kg   LMP 07/05/2022   SpO2 99%   BMI 27.28 kg/m  EFM: 145/mod/+a/-d  CVE: Dilation: 1 Effacement (%): 60 Cervical Position: Anterior Station: -2 Presentation: Vertex Exam by:: Dr. Lucianne Muss   A&P: 27 y.o. G1P0 [redacted]w[redacted]d here for IOL 2/2 pre-e w/o SF #Labor: Cx unchanged, FB placed at this time, filled to 40cc, pt tolerated well  will hold off on repeating cytotec as pt contracting a good bit  anticipate cx change with FB #Pain: Nitrous, support #FWB: Cat  #GBS negative  #Pre-e w/o SF: monitoring BP - mild range pressures, no SF  Sundra Aland, MD 2:31 AM

## 2023-03-17 NOTE — Discharge Summary (Signed)
Postpartum Discharge Summary  Date of Service updated***     Patient Name: Courtney Huang DOB: 11/26/1995 MRN: [id number]  Date of admission: 03/16/2023 Delivery date:03/17/2023 Delivering provider: Sundra Aland Date of discharge: 03/17/2023  Admitting diagnosis: Preeclampsia, third trimester [O14.93] Intrauterine pregnancy: [redacted]w[redacted]d     Secondary diagnosis:  Principal Problem:   SVD (spontaneous vaginal delivery) Active Problems:   Preeclampsia, third trimester  Additional problems: ***    Discharge diagnosis: Term Pregnancy Delivered and Preeclampsia (mild)                                              Post partum procedures:{Postpartum procedures:23558} Augmentation: AROM, Pitocin, and Cytotec Complications: {OB Labor/Delivery Complications:20784}  Hospital course: Induction of Labor With Vaginal Delivery   27 y.o. yo G1P0 at [redacted]w[redacted]d was admitted to the hospital 03/16/2023 for induction of labor.  Indication for induction: Preeclampsia.  Patient had an uncomplicated labor course. She required multiple agents including TXA, Hemabate, and rectal Cytotec postpartum for bleeding, with improvement of bleeding noted with above interventions. Membrane Rupture Time/Date: 3:13 AM,03/17/2023  Delivery Method:Vaginal, Spontaneous Operative Delivery:N/A Episiotomy: None Lacerations:  1st degree Details of delivery can be found in separate delivery note.  Patient had a postpartum course complicated by***. Patient is discharged home 03/17/23.  Newborn Data: Birth date:03/17/2023 Birth time:6:26 AM Gender:Female Living status:Living Apgars:9 ,9  Weight:   Magnesium Sulfate received: No BMZ received: No Rhophylac:N/A MMR:N/A T-DaP:Given prenatally Flu: N/A Transfusion:{Transfusion received:30440034}  Physical exam  Vitals:   03/17/23 0645 03/17/23 0700 03/17/23 0718 03/17/23 0729  BP: 135/85 132/82 (!) 133/93 137/87  Pulse: 88 86 80 92  Resp:      Temp:      TempSrc:      SpO2:       Weight:      Height:       General: {Exam; general:21111117} Lochia: {Desc; appropriate/inappropriate:30686::"appropriate"} Uterine Fundus: {Desc; firm/soft:30687} Incision: {Exam; incision:21111123} DVT Evaluation: {Exam; dvt:2111122} Labs: Lab Results  Component Value Date   WBC 12.4 (H) 03/17/2023   HGB 10.6 (L) 03/17/2023   HCT 33.7 (L) 03/17/2023   MCV 83.6 03/17/2023   PLT 158 03/17/2023      Latest Ref Rng & Units 03/16/2023    6:24 PM  CMP  Glucose 70 - 99 mg/dL 77   BUN 6 - 20 mg/dL 7   Creatinine 6.64 - 4.03 mg/dL 4.74   Sodium 259 - 563 mmol/L 135   Potassium 3.5 - 5.1 mmol/L 3.8   Chloride 98 - 111 mmol/L 103   CO2 22 - 32 mmol/L 20   Calcium 8.9 - 10.3 mg/dL 9.4   Total Protein 6.5 - 8.1 g/dL 6.3   Total Bilirubin 0.3 - 1.2 mg/dL 0.5   Alkaline Phos 38 - 126 U/L 103   AST 15 - 41 U/L 26   ALT 0 - 44 U/L 20    Edinburgh Score:     No data to display           After visit meds:  Allergies as of 03/17/2023       Reactions   Other Hives   Allergy to certain candies, perfume      Med Rec must be completed prior to using this [facility]***        Discharge home in stable condition Infant Feeding: {Baby feeding:23562} Infant  Disposition:{CHL IP OB HOME WITH YQIHKV:42595} Discharge instruction: per After Visit Summary and Postpartum booklet. Activity: Advance as tolerated. Pelvic rest for 6 weeks.  Diet: {OB GLOV:56433295} Future Appointments: Future Appointments  Date Time Provider Department Center  03/22/2023 10:15 AM Venora Maples, MD [facility]  03/29/2023 10:15 AM Venora Maples, MD [facility]  04/08/2023 10:55 AM Federico Flake, MD [facility]  04/08/2023 11:15 AM [facility]   Follow up Visit:  Message sent to [facility] 9/8 Please schedule this patient for a In person postpartum visit in 6 weeks with the following provider: Any provider. Additional Postpartum F/U:BP check 1 week  High risk pregnancy  complicated by: pre-eclampsia Delivery mode:  Vaginal, Spontaneous Anticipated Birth Control:   POP vs Depo   03/17/2023 Sundra Aland, MD

## 2023-03-17 NOTE — Lactation Note (Signed)
This note was copied from a baby's chart. Lactation Consultation Note  Patient Name: Courtney Huang [id number] Date: 03/17/2023 Age:27 Reason for consult: Initial assessment;Early term 37-38.6wks;1st time breastfeeding;Breastfeeding assistance  Visited P1 Birth Parent of early term infant "Courtney Huang". LC assisted with latch and "Courtney Huang" latched with ease and breastfed for 10 minutes. LC demonstrated hand expression and Birth Parent had easily expressed colostrum. Reviewed normal newborn feeding patterns, hunger cues, and breast expectations for the next 24 hours.   Feeding Plan 1) Breastfeed baby skin to skin every 2-3 hours, or sooner on demand if baby cues. 2) Call RN/LC for breastfeeding assistance.  Maternal Data Has patient been taught Hand Expression?: Yes Does the patient have breastfeeding experience prior to this delivery?: No  Feeding Mother's Current Feeding Choice: Breast Milk  LATCH Score Latch: Repeated attempts needed to sustain latch, nipple held in mouth throughout feeding, stimulation needed to elicit sucking reflex.  Audible Swallowing: A few with stimulation  Type of Nipple: Everted at rest and after stimulation  Comfort (Breast/Nipple): Soft / non-tender  Hold (Positioning): No assistance needed to correctly position infant at breast.  LATCH Score: 8  Interventions Interventions: Breast feeding basics reviewed;Assisted with latch;Skin to skin;Hand express;Breast compression;Adjust position;Position options;Support pillows;Education;LC Services brochure  Discharge Pump: DEBP;Personal  Consult Status Consult Status: Follow-up Date: 03/18/23 Follow-up type: In-patient    Antionette Char 03/17/2023, 3:05 PM

## 2023-03-17 NOTE — Progress Notes (Signed)
Labor Progress Note Courtney Huang is a 27 y.o. G1P0 at [redacted]w[redacted]d presented for IOL 2/2 pre-e w/o SF  S: Pt uncomfortable at bedside, but coping well.  O:  BP 139/89   Pulse 86   Temp 98.4 F (36.9 C) (Oral)   Resp 18   Ht 5\' 6"  (1.676 m)   Wt 76.7 kg   LMP 07/05/2022   SpO2 100%   BMI 27.28 kg/m  EFM: 145/mod/+a/-d  CVE: Dilation: 3 Effacement (%): 60 Cervical Position: Anterior Station: -2 Presentation: Vertex Exam by:: Dr. Lucianne Muss   A&P: 27 y.o. G1P0 [redacted]w[redacted]d here for IOL 2/2 pre-e w/o SF #Labor: FB out  d/w AROM with patient and she is agreeable, AROM with clear fluid  #Pain: Nitrous, support #FWB: Cat  #GBS negative  #Pre-e w/o SF: monitoring BP - mild range pressures, no SF  Sundra Aland, MD 3:53 AM

## 2023-03-18 LAB — GC/CHLAMYDIA PROBE AMP (~~LOC~~) NOT AT ARMC
Chlamydia: NEGATIVE
Comment: NEGATIVE
Comment: NORMAL
Neisseria Gonorrhea: NEGATIVE

## 2023-03-18 MED ORDER — FUROSEMIDE 20 MG PO TABS
20.0000 mg | ORAL_TABLET | Freq: Every day | ORAL | Status: DC
  Administered 2023-03-18 – 2023-03-19 (×2): 20 mg via ORAL
  Filled 2023-03-18 (×2): qty 1

## 2023-03-18 NOTE — Lactation Note (Signed)
This note was copied from a baby's chart. Lactation Consultation Note Mom holding baby STS. Mom stated baby has been cluster feeding last 2 hours. Discussed newborn feeding habits, behavior, STS, I&O. Mom encouraged to feed baby 8-12 times/24 hours and with feeding cues.  Mom denies painful latches at this time. Encouraged to call for assistance as needed.  Patient Name: Courtney Huang [id number] Date: 03/18/2023 Age:27 hours Reason for consult: Follow-up assessment;Early term 37-38.6wks   Maternal Data    Feeding    LATCH Score                    Lactation Tools Discussed/Used    Interventions    Discharge    Consult Status Consult Status: Follow-up Date: 03/18/23 Follow-up type: In-patient    Diamantina Edinger, Diamond Nickel 03/18/2023, 6:11 AM

## 2023-03-18 NOTE — Progress Notes (Signed)
POSTPARTUM PROGRESS NOTE  Post Partum Day #1  Subjective:  Courtney Huang is a 27 y.o. G1P1001 s/p NSVD at [redacted]w[redacted]d.  She reports she is doing well. No acute events overnight. She denies any problems with ambulating, voiding or po intake. Denies nausea or vomiting.  Pain is well controlled.  Lochia is moderate.  Objective: Blood pressure 116/80, pulse 88, temperature 98.2 F (36.8 C), temperature source Oral, resp. rate 17, height 5\' 6"  (1.676 m), weight 76.7 kg, last menstrual period 07/05/2022, SpO2 100%, unknown if currently breastfeeding.  Physical Exam:  General: alert, cooperative and no distress Chest: no respiratory distress Heart:regular rate, distal pulses intact Abdomen: soft, nontender,  Uterine Fundus: firm, appropriately tender DVT Evaluation: No calf swelling or tenderness Extremities: no LE edema Skin: warm, dry  Recent Labs    03/16/23 1824 03/17/23 0528  HGB 11.1* 10.6*  HCT 34.6* 33.7*    Assessment/Plan: Courtney Huang is a 27 y.o. G1P1001 s/p NSVD at [redacted]w[redacted]d   PPD#1 - Doing well  Routine postpartum care Mild pre-eclampsia: normotensive at present, start lasix, monitor for need for anti-HTN meds Contraception: Depo, ordered Feeding: breast and bottle Dispo: Plan for discharge PPD#2.   LOS: 2 days    Venora Maples, MD/MPH Attending Family Medicine Physician, [facility] for [facility], [facility]  03/18/2023, 1:09 PM

## 2023-03-19 ENCOUNTER — Other Ambulatory Visit (HOSPITAL_COMMUNITY): Payer: Self-pay

## 2023-03-19 LAB — CULTURE, BETA STREP (GROUP B ONLY): Strep Gp B Culture: NEGATIVE

## 2023-03-19 MED ORDER — MEDROXYPROGESTERONE ACETATE 150 MG/ML IM SUSP
150.0000 mg | Freq: Once | INTRAMUSCULAR | Status: AC
  Administered 2023-03-19: 150 mg via INTRAMUSCULAR
  Filled 2023-03-19: qty 1

## 2023-03-19 MED ORDER — FUROSEMIDE 20 MG PO TABS
20.0000 mg | ORAL_TABLET | Freq: Every day | ORAL | 0 refills | Status: DC
  Filled 2023-03-19: qty 4, 4d supply, fill #0

## 2023-03-19 MED ORDER — ACETAMINOPHEN 325 MG PO TABS
650.0000 mg | ORAL_TABLET | ORAL | 0 refills | Status: DC | PRN
  Filled 2023-03-19: qty 100, 9d supply, fill #0

## 2023-03-19 MED ORDER — IBUPROFEN 600 MG PO TABS
600.0000 mg | ORAL_TABLET | Freq: Four times a day (QID) | ORAL | 0 refills | Status: DC
  Filled 2023-03-19: qty 30, 8d supply, fill #0

## 2023-03-19 MED ORDER — ACETAMINOPHEN 325 MG PO TABS
650.0000 mg | ORAL_TABLET | ORAL | 0 refills | Status: DC | PRN
Start: 2023-03-19 — End: 2023-03-19
  Filled 2023-03-19: qty 100, 9d supply, fill #0

## 2023-03-19 NOTE — Lactation Note (Signed)
This note was copied from a baby's chart. Lactation Consultation Note  Patient Name: Courtney Huang [id number] Date: 03/19/2023 Age:27 hours  Reason for consult: Follow-up assessment;Primapara;1st time breastfeeding;Early term 37-38.6wks;MD order;Infant weight loss  P1, [redacted]w[redacted]d, 6% weight loss  Mother has been exclusively breastfeeding baby Courtney "Xen". Baby recently fed and is sleeping now. Mother to call with next breastfeeding to assess latch and provide additional teaching. Mother states she is unsure how to determine swallowing but she feels he is feeding well. Mother's breast are fuller, positive breast changes in pregnancy, feeling uterine cramping with feedings and has leaked drops of colostrum between feedings.   Mom made aware of O/P services, breastfeeding support groups, community resources, and our phone # for post-discharge questions.    Feeding Mother's Current Feeding Choice: Breast Milk  LATCH Score  Mother to call with next breastfeeding    Interventions Interventions: Education Mother has a breast pump at home  Discharge Discharge Education: Engorgement and breast care;Warning signs for feeding baby  Consult Status Consult Status: Follow-up Date: 03/18/23    Omar Person 03/19/2023, 9:29 AM

## 2023-03-19 NOTE — Lactation Note (Signed)
This note was copied from a baby's chart. Lactation Consultation Note  Patient Name: Courtney Huang [id number] Date: 03/19/2023 Age:27 hours  Reason for consult: Follow-up assessment;1st time breastfeeding;Primapara;Breastfeeding assistance  Mother called out for [facility]. Baby was ready to breastfeed. Mother had baby positioned in a cradle position and he was latched shallow on the breast with "clicking" noted. Mother receptive to basic breastfeeding education. Mother was taught how to hold baby in cross cradle position, latch baby deeper with wide gape and chin into the breast. Mother felt tugs, non-painful latch, and audible swallows noted.    Feeding Mother's Current Feeding Choice: Breast Milk  LATCH Score Latch: Grasps breast easily, tongue down, lips flanged, rhythmical sucking.  Audible Swallowing: Spontaneous and intermittent  Type of Nipple: Everted at rest and after stimulation  Comfort (Breast/Nipple): Soft / non-tender  Hold (Positioning): Assistance needed to correctly position infant at breast and maintain latch.  LATCH Score: 9   Lactation Tools Discussed/Used    Interventions Interventions: Breast feeding basics reviewed;Assisted with latch;Hand express;Breast compression;Adjust position;Support pillows;Education  Discharge Discharge Education: Engorgement and breast care;Warning signs for feeding baby  Consult Status Consult Status: Follow-up Date: 03/18/23    Omar Person 03/19/2023, 10:59 AM

## 2023-03-22 ENCOUNTER — Encounter: Payer: Medicaid Other | Admitting: Family Medicine

## 2023-03-29 ENCOUNTER — Encounter: Payer: Medicaid Other | Admitting: Family Medicine

## 2023-04-05 ENCOUNTER — Inpatient Hospital Stay (HOSPITAL_COMMUNITY): Admission: AD | Admit: 2023-04-05 | Payer: Medicaid Other | Source: Home / Self Care

## 2023-04-08 ENCOUNTER — Encounter: Payer: Medicaid Other | Admitting: Family Medicine

## 2023-04-08 ENCOUNTER — Other Ambulatory Visit: Payer: Medicaid Other

## 2023-04-13 ENCOUNTER — Inpatient Hospital Stay (HOSPITAL_COMMUNITY): Payer: Medicaid Other

## 2023-04-13 ENCOUNTER — Encounter (HOSPITAL_COMMUNITY): Payer: Self-pay | Admitting: Obstetrics & Gynecology

## 2023-04-13 ENCOUNTER — Other Ambulatory Visit: Payer: Self-pay

## 2023-04-13 ENCOUNTER — Inpatient Hospital Stay (HOSPITAL_COMMUNITY)
Admission: AD | Admit: 2023-04-13 | Discharge: 2023-04-13 | Disposition: A | Discharge status: Home / Self Care | Payer: Medicaid Other | Attending: Obstetrics & Gynecology | Admitting: Obstetrics & Gynecology

## 2023-04-13 DIAGNOSIS — O99893 Other specified diseases and conditions complicating puerperium: Secondary | ICD-10-CM | POA: Diagnosis not present

## 2023-04-13 DIAGNOSIS — M791 Myalgia, unspecified site: Secondary | ICD-10-CM

## 2023-04-13 DIAGNOSIS — R0789 Other chest pain: Secondary | ICD-10-CM | POA: Insufficient documentation

## 2023-04-13 LAB — CBC WITH DIFFERENTIAL/PLATELET
Abs Immature Granulocytes: 0.01 10*3/uL (ref 0.00–0.07)
Basophils Absolute: 0 10*3/uL (ref 0.0–0.1)
Basophils Relative: 1 %
Eosinophils Absolute: 0.2 10*3/uL (ref 0.0–0.5)
Eosinophils Relative: 5 %
HCT: 32.8 % — ABNORMAL LOW (ref 36.0–46.0)
Hemoglobin: 10.3 g/dL — ABNORMAL LOW (ref 12.0–15.0)
Immature Granulocytes: 0 %
Lymphocytes Relative: 35 %
Lymphs Abs: 1.5 10*3/uL (ref 0.7–4.0)
MCH: 25.7 pg — ABNORMAL LOW (ref 26.0–34.0)
MCHC: 31.4 g/dL (ref 30.0–36.0)
MCV: 81.8 fL (ref 80.0–100.0)
Monocytes Absolute: 0.4 10*3/uL (ref 0.1–1.0)
Monocytes Relative: 9 %
Neutro Abs: 2.2 10*3/uL (ref 1.7–7.7)
Neutrophils Relative %: 50 %
Platelets: 240 10*3/uL (ref 150–400)
RBC: 4.01 MIL/uL (ref 3.87–5.11)
RDW: 16.4 % — ABNORMAL HIGH (ref 11.5–15.5)
WBC: 4.3 10*3/uL (ref 4.0–10.5)
nRBC: 0 % (ref 0.0–0.2)

## 2023-04-13 LAB — COMPREHENSIVE METABOLIC PANEL
ALT: 52 U/L — ABNORMAL HIGH (ref 0–44)
AST: 41 U/L (ref 15–41)
Albumin: 3.6 g/dL (ref 3.5–5.0)
Alkaline Phosphatase: 67 U/L (ref 38–126)
Anion gap: 10 (ref 5–15)
BUN: 6 mg/dL (ref 6–20)
CO2: 25 mmol/L (ref 22–32)
Calcium: 9.2 mg/dL (ref 8.9–10.3)
Chloride: 104 mmol/L (ref 98–111)
Creatinine, Ser: 0.81 mg/dL (ref 0.44–1.00)
GFR, Estimated: >60 mL/min (ref >60)
Glucose, Bld: 89 mg/dL (ref 70–99)
Potassium: 3.5 mmol/L (ref 3.5–5.1)
Sodium: 139 mmol/L (ref 135–145)
Total Bilirubin: 0.3 mg/dL (ref 0.3–1.2)
Total Protein: 6.8 g/dL (ref 6.5–8.1)

## 2023-04-13 NOTE — MAU Note (Signed)
Courtney Huang is a 27 y.o. at Unknown here in MAU reporting: she's having heavy VB that has continued since giving birth on 03/17/2023.  Reports she's changing a sanitary napkin every 2 hours and passing intermittent clots.   S/P NVD 03/17/2023 LMP: NA Onset of complaint: ongoing Pain score: 0 Vitals:   04/13/23 1653  BP: 125/70  Pulse: 83  Resp: 19  Temp: 98.3 F (36.8 C)  SpO2: 100%     FHT:NA Lab orders placed from triage:   None

## 2023-04-13 NOTE — MAU Provider Note (Signed)
PP Vaginal Bleeding     S Ms. Torina Ey Christensen is a 27 y.o. G1P1001 non-pregnant [redacted]week PP female  who presents to MAU today with complaint of vaginal bleeding.  Pt states that she has at times heavier flow of bleeding and clots.  Showed picture and appears to be older blood with moderate size clot.  Denies F/C, SOB, palpitations.  States she does have intermittent tightening that's non-radiating in her L chest that she was concerned about her lung.  Otherwise no abdominal pain, N/V, fevers.   Receives care at [facility]. Prenatal records reviewed.  Pertinent items noted in HPI and remainder of comprehensive ROS otherwise negative.   O BP 118/73 (BP Location: Right Arm)   Pulse 66   Temp 98.3 F (36.8 C) (Oral)   Resp 19   Ht 5\' 6"  (1.676 m)   Wt 66.1 kg   SpO2 100%   BMI 23.52 kg/m  Physical Exam Vitals and nursing note reviewed. Exam conducted with a chaperone present.  Constitutional:      General: She is not in acute distress.    Appearance: Normal appearance. She is normal weight. She is not ill-appearing.  HENT:     Head: Normocephalic and atraumatic.     Right Ear: External ear normal.     Left Ear: External ear normal.     Nose: Nose normal.     Mouth/Throat:     Mouth: Mucous membranes are moist.     Pharynx: Oropharynx is clear.  Eyes:     General: No scleral icterus.    Conjunctiva/sclera: Conjunctivae normal.  Cardiovascular:     Rate and Rhythm: Normal rate and regular rhythm.     Heart sounds: No murmur heard.    No friction rub. No gallop.  Pulmonary:     Effort: Pulmonary effort is normal. No respiratory distress.     Breath sounds: Normal breath sounds.  Abdominal:     General: Abdomen is flat. There is no distension.     Palpations: Abdomen is soft.     Tenderness: There is no abdominal tenderness.     Comments: Fundus firm, below the level of umbilicus   Genitourinary:    General: Normal vulva.     Comments: Blood noticed in posterior vaginal fault,  no active bleeding from external os.  Musculoskeletal:        General: No swelling. Normal range of motion.     Cervical back: Normal range of motion.  Skin:    General: Skin is warm and dry.  Neurological:     General: No focal deficit present.     Mental Status: She is alert and oriented to person, place, and time. Mental status is at baseline.     Motor: No weakness.     Gait: Gait normal.  Psychiatric:        Behavior: Behavior normal.        Thought Content: Thought content normal.        Judgment: Judgment normal.     Comments: Anxious       MDM: moderate  MAU Course:  [place] negative for retained products of conception Hgb stable 10.3, no leukocytosis  Pt describes less mobility at home with much laying down, so given no active bleeding but pooled blood products in vaginal fault, most likely locia is less than normal menses but collectin in posterior fornix and expelling together during times of mobility.  Pt stated L chest tightening at random times, no apparent  trigger, things making worse.  Lung exam and cardiac physical exam reassuring.  Non radiating so do not believe to be ACS, especially given HDS, SORA.    A&P: #Postpartum vaginal bleeding Believe lochia to be less than normal mensis and due to poor mobility having collection of bleeding expelled at same time.  Stable vitals, hgb, and no active bleeding noted on pelvic exam is reassuring.  F/u on 10/8.   #L sided MSK Chest discomfort Recommended stretches and tylenol PRN   Discharge from MAU in stable condition with strict/usual precautions Follow up at [facility] as scheduled for ongoing prenatal care  Allergies as of 04/13/2023       Reactions   Other Hives   Allergy to certain candies, perfume         Medication List     TAKE these medications    acetaminophen 325 MG tablet Commonly known as: Tylenol Take 2 tablets (650 mg total) by mouth every 4 (four) hours as needed (for pain scale < 4).   Blood Pressure  Kit Devi 1 Device by Does not apply route as needed.   furosemide 20 MG tablet Commonly known as: LASIX Take 1 tablet (20 mg total) by mouth daily for 4 days.   ibuprofen 600 MG tablet Commonly known as: ADVIL Take 1 tablet (600 mg total) by mouth every 6 (six) hours.   multivitamin-prenatal 27-0.8 MG Tabs tablet Take 1 tablet by mouth daily at 12 noon.        Hessie Dibble, MD 04/13/2023 7:51 PM

## 2023-04-16 ENCOUNTER — Ambulatory Visit (INDEPENDENT_AMBULATORY_CARE_PROVIDER_SITE_OTHER): Payer: Medicaid Other | Admitting: Obstetrics and Gynecology

## 2023-04-16 ENCOUNTER — Ambulatory Visit: Payer: Medicaid Other | Admitting: Obstetrics and Gynecology

## 2023-04-16 ENCOUNTER — Encounter: Payer: Self-pay | Admitting: Obstetrics and Gynecology

## 2023-04-16 NOTE — Progress Notes (Signed)
    Post Partum Visit Note  Courtney Huang is a 27 y.o. G42P1001 female who presents for a postpartum visit. She is 4 weeks postpartum following a normal spontaneous vaginal delivery.  I have fully reviewed the prenatal and intrapartum course. The delivery was at 37.2 gestational weeks.  Anesthesia: local. Postpartum course has been. Baby is doing well. Baby is feeding by breast. Bleeding thin lochia. Bowel function is normal. Bladder function is normal. Patient is not sexually active. Contraception method is Depo-Provera injections. Postpartum depression screening: negative.   The pregnancy intention screening data noted above was reviewed. Potential methods of contraception were discussed. The patient elected to proceed with depo provera    Health Maintenance Due  Topic Date Due   INFLUENZA VACCINE  Never done   COVID-19 Vaccine (1 - 2023-24 season) Never done    The following portions of the patient's history were reviewed and updated as appropriate: allergies, current medications, past family history, past medical history, past social history, past surgical history, and problem list.  Review of Systems Pertinent items noted in HPI and remainder of comprehensive ROS otherwise negative.  Objective:  There were no vitals taken for this visit.   General:  alert, cooperative, and appears stated age   Breasts:  not indicated  Lungs: Comfortalbe on room air  Wound N/a  GU exam:   N/a        Assessment:  1. Postpartum care and examination Return in one month for depo shot    Plan:   Essential components of care per ACOG recommendations:  1.  Mood and well being: Patient with negative depression screening today. Reviewed local resources for support.  - Patient tobacco use? No.   - hx of drug use? No.    2. Infant care and feeding:  -Patient currently breastmilk feeding? Yes. Reviewed importance of draining breast regularly to support lactation.  -Social determinants of  health (SDOH) reviewed in EPIC. No concerns  3. Sexuality, contraception and birth spacing - Patient does not want a pregnancy in the next year.  Desired family size is unsure children.  - Reviewed reproductive life planning. Reviewed contraceptive methods based on pt preferences and effectiveness.  Patient desired Depo-Provera.   - Discussed birth spacing of 18 months  4. Sleep and fatigue -Encouraged family/partner/community support of 4 hrs of uninterrupted sleep to help with mood and fatigue  5. Physical Recovery  - Discussed patients delivery and complications. She describes her labor as good. - Patient had a Vaginal, no problems at delivery. Patient had a 1st degree laceration. Perineal healing reviewed. Patient expressed understanding - Patient has urinary incontinence? No. - Patient is safe to resume physical and sexual activity  6.  Health Maintenance - HM due items addressed Yes - Last pap smear  Diagnosis  Date Value Ref Range Status  09/05/2022 (A)  Final   - Atypical squamous cells of undetermined significance (ASC-US)   Pap smear not done at today's visit.  -Breast Cancer screening indicated? No.   7. Chronic Disease/Pregnancy Condition follow up: Hypertension Normotensive, not on meds.   Future Appointments  Date Time Provider Department Center  06/04/2023  1:30 PM [facility] NURSE [facility]     Albertine Grates, FNP [facility] [employer], [facility]

## 2023-04-17 ENCOUNTER — Telehealth (HOSPITAL_COMMUNITY): Payer: Self-pay | Admitting: *Deleted

## 2023-04-17 NOTE — Telephone Encounter (Signed)
Attempted hospital discharge follow-up call. Left message for patient to return RN call with any questions or concerns. Deforest Hoyles, RN, 04/17/23, [phone]

## 2023-04-21 ENCOUNTER — Emergency Department (HOSPITAL_COMMUNITY)
Admission: EM | Admit: 2023-04-21 | Discharge: 2023-04-22 | Disposition: A | Discharge status: Home / Self Care | Payer: Medicaid Other | Attending: Emergency Medicine | Admitting: Emergency Medicine

## 2023-04-21 DIAGNOSIS — R112 Nausea with vomiting, unspecified: Secondary | ICD-10-CM | POA: Diagnosis not present

## 2023-04-21 DIAGNOSIS — N939 Abnormal uterine and vaginal bleeding, unspecified: Secondary | ICD-10-CM | POA: Diagnosis not present

## 2023-04-21 DIAGNOSIS — E876 Hypokalemia: Secondary | ICD-10-CM | POA: Diagnosis not present

## 2023-04-21 DIAGNOSIS — D649 Anemia, unspecified: Secondary | ICD-10-CM | POA: Insufficient documentation

## 2023-04-21 DIAGNOSIS — R102 Pelvic and perineal pain: Secondary | ICD-10-CM | POA: Diagnosis not present

## 2023-04-21 DIAGNOSIS — R109 Unspecified abdominal pain: Secondary | ICD-10-CM | POA: Diagnosis present

## 2023-04-22 ENCOUNTER — Other Ambulatory Visit (HOSPITAL_COMMUNITY): Payer: Self-pay

## 2023-04-22 ENCOUNTER — Other Ambulatory Visit: Payer: Self-pay

## 2023-04-22 LAB — CBC WITH DIFFERENTIAL/PLATELET
Abs Immature Granulocytes: 0.02 10*3/uL (ref 0.00–0.07)
Basophils Absolute: 0 10*3/uL (ref 0.0–0.1)
Basophils Relative: 0 %
Eosinophils Absolute: 0.1 10*3/uL (ref 0.0–0.5)
Eosinophils Relative: 1 %
HCT: 35.6 % — ABNORMAL LOW (ref 36.0–46.0)
Hemoglobin: 11 g/dL — ABNORMAL LOW (ref 12.0–15.0)
Immature Granulocytes: 0 %
Lymphocytes Relative: 10 %
Lymphs Abs: 0.7 10*3/uL (ref 0.7–4.0)
MCH: 25.9 pg — ABNORMAL LOW (ref 26.0–34.0)
MCHC: 30.9 g/dL (ref 30.0–36.0)
MCV: 84 fL (ref 80.0–100.0)
Monocytes Absolute: 0.6 10*3/uL (ref 0.1–1.0)
Monocytes Relative: 8 %
Neutro Abs: 5.9 10*3/uL (ref 1.7–7.7)
Neutrophils Relative %: 81 %
Platelets: 249 10*3/uL (ref 150–400)
RBC: 4.24 MIL/uL (ref 3.87–5.11)
RDW: 15.6 % — ABNORMAL HIGH (ref 11.5–15.5)
WBC: 7.2 10*3/uL (ref 4.0–10.5)
nRBC: 0 % (ref 0.0–0.2)

## 2023-04-22 LAB — URINALYSIS, ROUTINE W REFLEX MICROSCOPIC
Bilirubin Urine: NEGATIVE
Glucose, UA: NEGATIVE mg/dL
Ketones, ur: NEGATIVE mg/dL
Nitrite: NEGATIVE
Protein, ur: 100 mg/dL — AB
RBC / HPF: >50 RBC/hpf (ref 0–5)
Specific Gravity, Urine: 1.024 (ref 1.005–1.030)
pH: 7 (ref 5.0–8.0)

## 2023-04-22 LAB — COMPREHENSIVE METABOLIC PANEL
ALT: 56 U/L — ABNORMAL HIGH (ref 0–44)
AST: 44 U/L — ABNORMAL HIGH (ref 15–41)
Albumin: 4.3 g/dL (ref 3.5–5.0)
Alkaline Phosphatase: 65 U/L (ref 38–126)
Anion gap: 7 (ref 5–15)
BUN: 5 mg/dL — ABNORMAL LOW (ref 6–20)
CO2: 25 mmol/L (ref 22–32)
Calcium: 9 mg/dL (ref 8.9–10.3)
Chloride: 106 mmol/L (ref 98–111)
Creatinine, Ser: 0.73 mg/dL (ref 0.44–1.00)
GFR, Estimated: >60 mL/min (ref >60)
Glucose, Bld: 98 mg/dL (ref 70–99)
Potassium: 2.9 mmol/L — ABNORMAL LOW (ref 3.5–5.1)
Sodium: 138 mmol/L (ref 135–145)
Total Bilirubin: 0.8 mg/dL (ref 0.3–1.2)
Total Protein: 7.8 g/dL (ref 6.5–8.1)

## 2023-04-22 LAB — HCG, QUANTITATIVE, PREGNANCY: hCG, Beta Chain, Quant, S: 1 m[IU]/mL (ref <5)

## 2023-04-22 LAB — LIPASE, BLOOD: Lipase: 40 U/L (ref 11–51)

## 2023-04-22 MED ORDER — ONDANSETRON 4 MG PO TBDP
4.0000 mg | ORAL_TABLET | Freq: Four times a day (QID) | ORAL | 0 refills | Status: DC | PRN
  Filled 2023-04-22: qty 20, 5d supply, fill #0

## 2023-04-22 MED ORDER — POTASSIUM CHLORIDE CRYS ER 20 MEQ PO TBCR
40.0000 meq | EXTENDED_RELEASE_TABLET | Freq: Once | ORAL | Status: AC
  Administered 2023-04-22: 40 meq via ORAL
  Filled 2023-04-22: qty 2

## 2023-04-22 MED ORDER — ONDANSETRON 8 MG PO TBDP
8.0000 mg | ORAL_TABLET | Freq: Once | ORAL | Status: AC
  Administered 2023-04-22: 8 mg via ORAL
  Filled 2023-04-22: qty 1

## 2023-04-22 MED ORDER — POTASSIUM CHLORIDE CRYS ER 20 MEQ PO TBCR
20.0000 meq | EXTENDED_RELEASE_TABLET | Freq: Two times a day (BID) | ORAL | 0 refills | Status: DC
  Filled 2023-04-22: qty 20, 10d supply, fill #0

## 2023-04-22 NOTE — ED Triage Notes (Signed)
Pt c/o lower abdominal pain that started morning of 10/13. Reports ongoing bright red vaginal bleeding with small blood clots since vaginal birth on 9/8th. Changing pad approx every hour. Vaginal birth without complications. No fevers.

## 2023-04-22 NOTE — Discharge Instructions (Addendum)
Is okay to take acetaminophen and/or ibuprofen as needed for pain.  Please follow-up with your gynecologist regarding the ongoing bleeding.  Return if you have new or concerning symptoms.

## 2023-04-22 NOTE — ED Provider Notes (Signed)
[place] EMERGENCY DEPARTMENT AT [facility] Provider Note   CSN: [id number] Arrival date & time: 04/21/23  2350     History  Chief Complaint  Patient presents with   Abdominal Pain    Courtney Huang is a 27 y.o. female.  The history is provided by the patient.  Abdominal Pain She is approximately 5 weeks postpartum and comes in with an episode of pelvic cramping and nausea and vomiting earlier today.  She has also been having ongoing vaginal bleeding.  She states that she has to change her pad every 1-2 hours.  Bleeding has slowed down slightly.  She had been evaluated at the maternal admission unit about 10 days ago and was told to come in if she had increased pain and vomiting.  Her pain has now subsided and the nausea has subsided.  She is breast-feeding.   Home Medications Prior to Admission medications   Medication Sig Start Date End Date Taking? Authorizing Provider  ibuprofen (ADVIL) 600 MG tablet Take 1 tablet (600 mg total) by mouth every 6 (six) hours. 03/19/23   Wouk, Wilfred Curtis, MD  Prenatal Vit-Fe Fumarate-FA (MULTIVITAMIN-PRENATAL) 27-0.8 MG TABS tablet Take 1 tablet by mouth daily at 12 noon.    [provider]      Allergies    Other    Review of Systems   Review of Systems  Gastrointestinal:  Positive for abdominal pain.  All other systems reviewed and are negative.   Physical Exam Updated Vital Signs BP 120/77 (BP Location: Left Arm)   Pulse (!) 101   Temp 98.7 F (37.1 C) (Oral)   Resp 20   Ht 5\' 7"  (1.702 m)   Wt 68 kg   LMP  (LMP Unknown)   SpO2 100%   Breastfeeding Yes   BMI 23.49 kg/m  Physical Exam Vitals and nursing note reviewed.   27 year old female, resting comfortably and in no acute distress. Vital signs are significant for borderline elevated heart rate. Oxygen saturation is 100%, which is normal. Head is normocephalic and atraumatic. PERRLA, EOMI.  Back is nontender and there is no CVA  tenderness. Lungs are clear without rales, wheezes, or rhonchi. Chest is nontender. Heart has regular rate and rhythm without murmur. Abdomen is soft, flat, with minimal suprapubic tenderness. Pelvic: Normal external female genitalia, small amount of blood present in the vaginal vault.  Small amount of bloody mucus noted to be draining from the cervix.  Fundus is 6-8 weeks size and firm and nontender.  There are no adnexal masses or tenderness. Neurologic: Mental status is normal, cranial nerves are intact, moves all extremities equally.  ED Results / Procedures / Treatments   Labs (all labs ordered are listed, but only abnormal results are displayed) Labs Reviewed  CBC WITH DIFFERENTIAL/PLATELET - Abnormal; Notable for the following components:      Result Value   Hemoglobin 11.0 (*)    HCT 35.6 (*)    MCH 25.9 (*)    RDW 15.6 (*)    All other components within normal limits  COMPREHENSIVE METABOLIC PANEL  LIPASE, BLOOD  URINALYSIS, ROUTINE W REFLEX MICROSCOPIC  HCG, QUANTITATIVE, PREGNANCY   Procedures Procedures    Medications Ordered in ED Medications  ondansetron (ZOFRAN-ODT) disintegrating tablet 8 mg (8 mg Oral Given 04/22/23 0147)  potassium chloride SA (KLOR-CON M) CR tablet 40 mEq (40 mEq Oral Given 04/22/23 0148)    ED Course/ Medical Decision Making/ A&P  Medical Decision Making Amount and/or Complexity of Data Reviewed Labs: ordered.  Risk Prescription drug management.   Ongoing vaginal bleeding and pelvic cramping in patient who is 5 weeks postpartum.  I reviewed her past records, and she had vaginal delivery on 03/17/2023.  She was evaluated at maternal admissions unit on 04/13/2023 for ongoing bleeding at which time ultrasound showed no retained products of conception.  I have reviewed her laboratory tests, and my interpretation is mild anemia with hemoglobin having increased compared with 04/13/2023.  Moderate hypokalemia.   Minimal elevation of AST and ALT of questionable clinical significance.  Urinalysis does show greater than 50 RBCs likely from vaginal contamination.  I have ordered ondansetron oral dissolving tablet and oral potassium.  I have ordered a quantitative hCG level which is pending.  hCG level is 1, no evidence of retained products of conception.  Nausea is improved following ondansetron.  I am discharging her with prescription for ondansetron oral dissolving tablet and oral potassium.  I have instructed her to use over-the-counter NSAIDs and acetaminophen as needed for pain, follow-up with her obstetrician.  Final Clinical Impression(s) / ED Diagnoses Final diagnoses:  Abnormal vaginal bleeding  Pelvic cramping  Nausea and vomiting, unspecified vomiting type  Hypokalemia  Normocytic anemia    Rx / DC Orders ED Discharge Orders          Ordered    potassium chloride SA (KLOR-CON M) 20 MEQ tablet  2 times daily        04/22/23 0233    ondansetron (ZOFRAN-ODT) 4 MG disintegrating tablet        04/22/23 0233              Dione Booze, MD 04/22/23 [phone]

## 2023-06-03 NOTE — Progress Notes (Unsigned)
Courtney Huang here for Depo-Provera Injection. Patient received last injection on 03/19/23; patient is 11w since last injection--within window to receive next injection today. Injection administered without complication. Patient will return in 3 months for next injection between 08/20/23 and 09/03/23. Next annual visit due 04/15/24.   Meryl Crutch, RN 06/04/23 at

## 2023-06-04 ENCOUNTER — Ambulatory Visit: Payer: Medicaid Other

## 2023-06-04 ENCOUNTER — Other Ambulatory Visit: Payer: Self-pay

## 2023-06-04 VITALS — BP 128/78 | HR 76 | Ht 66.0 in | Wt 148.1 lb

## 2023-06-04 DIAGNOSIS — Z3042 Encounter for surveillance of injectable contraceptive: Secondary | ICD-10-CM | POA: Diagnosis not present

## 2023-06-04 MED ORDER — MEDROXYPROGESTERONE ACETATE 150 MG/ML IM SUSY
150.0000 mg | PREFILLED_SYRINGE | Freq: Once | INTRAMUSCULAR | Status: AC
Start: 2023-06-04 — End: 2023-06-04
  Administered 2023-06-04: 150 mg via INTRAMUSCULAR

## 2023-08-21 ENCOUNTER — Ambulatory Visit: Payer: Medicaid Other

## 2023-08-27 ENCOUNTER — Ambulatory Visit: Payer: Medicaid Other

## 2023-08-27 ENCOUNTER — Other Ambulatory Visit: Payer: Self-pay

## 2023-08-27 VITALS — BP 114/68 | HR 71 | Ht 66.0 in | Wt 154.0 lb

## 2023-08-27 DIAGNOSIS — Z3042 Encounter for surveillance of injectable contraceptive: Secondary | ICD-10-CM

## 2023-08-27 MED ORDER — MEDROXYPROGESTERONE ACETATE 150 MG/ML IM SUSY
150.0000 mg | PREFILLED_SYRINGE | Freq: Once | INTRAMUSCULAR | Status: AC
Start: 2023-08-27 — End: 2023-08-27
  Administered 2023-08-27: 150 mg via INTRAMUSCULAR

## 2023-08-27 NOTE — Progress Notes (Signed)
 Courtney Huang here for Depo-Provera Injection. Injection administered without complication. Patient will return in 3 months for next injection between 5/6 and 5/20. Next annual visit due 04/2024.   Quintella Reichert, RN 08/27/2023  3:39 PM

## 2023-11-14 ENCOUNTER — Ambulatory Visit: Payer: Medicaid Other

## 2023-11-19 ENCOUNTER — Other Ambulatory Visit (HOSPITAL_COMMUNITY)
Admission: RE | Admit: 2023-11-19 | Discharge: 2023-11-19 | Disposition: A | Discharge status: Home / Self Care | Source: Ambulatory Visit | Attending: Family Medicine | Admitting: Family Medicine

## 2023-11-19 ENCOUNTER — Ambulatory Visit (INDEPENDENT_AMBULATORY_CARE_PROVIDER_SITE_OTHER)

## 2023-11-19 VITALS — BP 111/62 | HR 70 | Ht 66.0 in | Wt 141.0 lb

## 2023-11-19 DIAGNOSIS — Z113 Encounter for screening for infections with a predominantly sexual mode of transmission: Secondary | ICD-10-CM | POA: Diagnosis present

## 2023-11-19 DIAGNOSIS — Z3042 Encounter for surveillance of injectable contraceptive: Secondary | ICD-10-CM

## 2023-11-19 MED ORDER — MEDROXYPROGESTERONE ACETATE 150 MG/ML IM SUSY
150.0000 mg | PREFILLED_SYRINGE | Freq: Once | INTRAMUSCULAR | Status: AC
Start: 2023-11-19 — End: 2023-11-19
  Administered 2023-11-19: 150 mg via INTRAMUSCULAR

## 2023-11-19 NOTE — Progress Notes (Signed)
 Courtney Huang here for Depo-Provera  Injection. Injection administered without complication. Patient will return in 3 months for next injection between July 29 and August 12. Next annual visit due 04/2024.   Patient also request to do complete STD screening. Patient denies any concerns or symptoms. Instructed patient how to obtain self vaginal swab. Notified patient that she will be reached out for any positive results. All questions addressed and patient informed to contact our office for any other questions or concerns.   Lennart Quitter, RN 11/19/2023  3:35 PM

## 2023-11-20 LAB — RPR+HBSAG+HCVAB+...
HIV Screen 4th Generation wRfx: NONREACTIVE
Hep C Virus Ab: NONREACTIVE
Hepatitis B Surface Ag: NEGATIVE
RPR Ser Ql: NONREACTIVE

## 2023-11-21 LAB — CERVICOVAGINAL ANCILLARY ONLY
Bacterial Vaginitis (gardnerella): NEGATIVE
Candida Glabrata: NEGATIVE
Candida Vaginitis: NEGATIVE
Chlamydia: NEGATIVE
Comment: NEGATIVE
Comment: NEGATIVE
Comment: NEGATIVE
Comment: NEGATIVE
Comment: NEGATIVE
Comment: NORMAL
Neisseria Gonorrhea: NEGATIVE
Trichomonas: NEGATIVE

## 2024-01-23 ENCOUNTER — Other Ambulatory Visit: Payer: Self-pay

## 2024-01-23 ENCOUNTER — Encounter (HOSPITAL_COMMUNITY): Payer: Self-pay

## 2024-01-23 ENCOUNTER — Emergency Department (HOSPITAL_COMMUNITY)
Admission: EM | Admit: 2024-01-23 | Discharge: 2024-01-24 | Disposition: A | Discharge status: Against Medical Advice | Attending: Emergency Medicine | Admitting: Emergency Medicine

## 2024-01-23 DIAGNOSIS — R55 Syncope and collapse: Secondary | ICD-10-CM | POA: Insufficient documentation

## 2024-01-23 DIAGNOSIS — R0602 Shortness of breath: Secondary | ICD-10-CM | POA: Diagnosis present

## 2024-01-23 DIAGNOSIS — Z5329 Procedure and treatment not carried out because of patient's decision for other reasons: Secondary | ICD-10-CM | POA: Diagnosis not present

## 2024-01-23 DIAGNOSIS — J168 Pneumonia due to other specified infectious organisms: Secondary | ICD-10-CM | POA: Diagnosis not present

## 2024-01-23 LAB — CBC
HCT: 39.2 % (ref 36.0–46.0)
Hemoglobin: 12.3 g/dL (ref 12.0–15.0)
MCH: 25.3 pg — ABNORMAL LOW (ref 26.0–34.0)
MCHC: 31.4 g/dL (ref 30.0–36.0)
MCV: 80.7 fL (ref 80.0–100.0)
Platelets: 232 K/uL (ref 150–400)
RBC: 4.86 MIL/uL (ref 3.87–5.11)
RDW: 14.3 % (ref 11.5–15.5)
WBC: 4.4 K/uL (ref 4.0–10.5)
nRBC: 0 % (ref 0.0–0.2)

## 2024-01-23 LAB — COMPREHENSIVE METABOLIC PANEL WITH GFR
ALT: 18 U/L (ref 0–44)
AST: 20 U/L (ref 15–41)
Albumin: 4.1 g/dL (ref 3.5–5.0)
Alkaline Phosphatase: 67 U/L (ref 38–126)
Anion gap: 10 (ref 5–15)
BUN: 9 mg/dL (ref 6–20)
CO2: 24 mmol/L (ref 22–32)
Calcium: 9.5 mg/dL (ref 8.9–10.3)
Chloride: 105 mmol/L (ref 98–111)
Creatinine, Ser: 0.93 mg/dL (ref 0.44–1.00)
GFR, Estimated: >60 mL/min (ref >60)
Glucose, Bld: 127 mg/dL — ABNORMAL HIGH (ref 70–99)
Potassium: 3.4 mmol/L — ABNORMAL LOW (ref 3.5–5.1)
Sodium: 139 mmol/L (ref 135–145)
Total Bilirubin: 0.9 mg/dL (ref 0.0–1.2)
Total Protein: 7.7 g/dL (ref 6.5–8.1)

## 2024-01-23 LAB — URINALYSIS, ROUTINE W REFLEX MICROSCOPIC
Bilirubin Urine: NEGATIVE
Glucose, UA: NEGATIVE mg/dL
Ketones, ur: NEGATIVE mg/dL
Leukocytes,Ua: NEGATIVE
Nitrite: NEGATIVE
Protein, ur: 30 mg/dL — AB
Specific Gravity, Urine: 1.01 (ref 1.005–1.030)
pH: 6 (ref 5.0–8.0)

## 2024-01-23 LAB — HCG, SERUM, QUALITATIVE: Preg, Serum: NEGATIVE

## 2024-01-23 LAB — CBG MONITORING, ED: Glucose-Capillary: 121 mg/dL — ABNORMAL HIGH (ref 70–99)

## 2024-01-23 NOTE — ED Provider Triage Note (Signed)
 Emergency Medicine Provider Triage Evaluation Note  Courtney Huang , a 28 y.o. female  was evaluated in triage.  Pt complains of syncope. Was at work, felt typical prodromal symptoms, sat but lost consciousness. Has had similar episode previously but not sure if she lot consciousness for that long. Was anemic during recent pregnancy (~10 mo ago).  Review of Systems  Positive: syncope Negative: Chest pain, abdominal pain, back pain, difficulty breathing   Physical Exam  BP (!) 127/90 (BP Location: Left Arm)   Pulse 84   Temp 98.2 F (36.8 C) (Oral)   Resp 18   SpO2 100%  Gen:   Awake, no distress   Resp:  Normal effort  MSK:   Moves extremities without difficulty  Other:    Medical Decision Making  Medically screening exam initiated at 6:14 PM.  Appropriate orders placed.  Courtney Huang was informed that the remainder of the evaluation will be completed by another provider, this initial triage assessment does not replace that evaluation, and the importance of remaining in the ED until their evaluation is complete.     Francesca Elsie CROME, MD 01/23/24 [phone]

## 2024-01-23 NOTE — ED Triage Notes (Signed)
 Pt is coming from work, she started feeling very warm, she has passed out in the past and she was trying to manage it but she ended up passing out. She was passed out for 3 to 5 mins. She has since then regained full gcs of 15. She was working in [employer] as well.   Medic vitals  143/97 100%ra 108bgl  18rr  20g Left AC 4mg  zofran 

## 2024-01-24 ENCOUNTER — Emergency Department (HOSPITAL_BASED_OUTPATIENT_CLINIC_OR_DEPARTMENT_OTHER)
Admission: EM | Admit: 2024-01-24 | Discharge: 2024-01-24 | Disposition: A | Discharge status: Home / Self Care | Source: Ambulatory Visit | Attending: Emergency Medicine | Admitting: Emergency Medicine

## 2024-01-24 ENCOUNTER — Emergency Department (HOSPITAL_BASED_OUTPATIENT_CLINIC_OR_DEPARTMENT_OTHER)

## 2024-01-24 ENCOUNTER — Emergency Department (HOSPITAL_COMMUNITY)

## 2024-01-24 DIAGNOSIS — J168 Pneumonia due to other specified infectious organisms: Secondary | ICD-10-CM | POA: Insufficient documentation

## 2024-01-24 DIAGNOSIS — J189 Pneumonia, unspecified organism: Secondary | ICD-10-CM

## 2024-01-24 DIAGNOSIS — R079 Chest pain, unspecified: Secondary | ICD-10-CM

## 2024-01-24 LAB — TROPONIN T, HIGH SENSITIVITY: Troponin T High Sensitivity: <15 ng/L (ref <19)

## 2024-01-24 LAB — D-DIMER, QUANTITATIVE: D-Dimer, Quant: 0.63 ug{FEU}/mL — ABNORMAL HIGH (ref 0.00–0.50)

## 2024-01-24 LAB — TROPONIN I (HIGH SENSITIVITY): Troponin I (High Sensitivity): <2 ng/L (ref <18)

## 2024-01-24 MED ORDER — AMOXICILLIN 500 MG PO CAPS: 500.0000 mg | ORAL_CAPSULE | Freq: Three times a day (TID) | ORAL | 0 refills | Status: AC

## 2024-01-24 MED ORDER — IOHEXOL 350 MG/ML SOLN
75.0000 mL | Freq: Once | INTRAVENOUS | Status: AC | PRN
  Administered 2024-01-24: 75 mL via INTRAVENOUS

## 2024-01-24 NOTE — ED Provider Notes (Signed)
 [place] EMERGENCY DEPARTMENT AT [facility] Provider Note   CSN: [id number] Arrival date & time: 01/24/24  1631     Patient presents with: Shortness of Breath   Courtney Huang is a 28 y.o. female.  With noncontributory past medical history reports to emergency room today with complaint of chest pain and shortness of breath that started earlier today on left lower chest. Notes some mild cough. Had suspected vasovagal syncope yesterday.  Had workup, but patient left AGAINST MEDICAL ADVICE at [facility] earlier this morning after having positive D-dimer.  CT was ordered but patient left prior to receiving imaging.      Shortness of Breath      Prior to Admission medications   Not on File    Allergies: Other    Review of Systems  Respiratory:  Positive for shortness of breath.     Updated Vital Signs BP 128/77   Pulse 82   Temp 98 F (36.7 C)   Resp 18   Ht 5' 6 (1.676 m)   Wt 69.9 kg   SpO2 100%   BMI 24.86 kg/m   Physical Exam Vitals and nursing note reviewed.  Constitutional:      General: She is not in acute distress.    Appearance: She is not toxic-appearing.  HENT:     Head: Normocephalic and atraumatic.  Eyes:     General: No scleral icterus.    Conjunctiva/sclera: Conjunctivae normal.  Cardiovascular:     Rate and Rhythm: Normal rate and regular rhythm.     Pulses: Normal pulses.     Heart sounds: Normal heart sounds.  Pulmonary:     Effort: Pulmonary effort is normal. No respiratory distress.     Breath sounds: Normal breath sounds.  Abdominal:     General: Abdomen is flat. Bowel sounds are normal.     Palpations: Abdomen is soft.     Tenderness: There is no abdominal tenderness.  Musculoskeletal:     Right lower leg: No edema.     Left lower leg: No edema.  Skin:    General: Skin is warm and dry.     Findings: No lesion.  Neurological:     General: No focal deficit present.     Mental Status: She is alert and oriented to  person, place, and time. Mental status is at baseline.     (all labs ordered are listed, but only abnormal results are displayed) Labs Reviewed  TROPONIN T, HIGH SENSITIVITY    EKG: None  Radiology: CT Angio Chest PE W and/or Wo Contrast Result Date: 01/24/2024 EXAM: CTA CHEST AORTA 01/24/2024 06:48:00 PM TECHNIQUE: CTA of the chest was performed after the administration of intravenous contrast. Multiplanar reformatted images are provided for review. MIP images are provided for review. Automated exposure control, iterative reconstruction, and/or weight based adjustment of the mA/kV was utilized to reduce the radiation dose to as low as reasonably achievable. COMPARISON: None available. CLINICAL HISTORY: Pulmonary embolism (PE) suspected, low to intermediate prob, positive D-dimer. FINDINGS: AORTA: No thoracic aortic dissection. No aneurysm. MEDIASTINUM: No mediastinal lymphadenopathy. The heart and pericardium demonstrate no acute abnormality. LYMPH NODES: No mediastinal, hilar or axillary lymphadenopathy. LUNGS AND PLEURA: No evidence of pulmonary embolism. At least 3 scattered left lung nodules measuring up to 5 mm (images 36, 43, and 101). Additional 9 mm ground glass nodule in the posterior left upper lobe (image 48). Given the patient's age, these are all considered benign and likely infectious or inflammatory,  without follow up suggested per Fleischner Society guidelines. No focal consolidation or pulmonary edema. No pleural effusion or pneumothorax. UPPER ABDOMEN: Limited images of the upper abdomen are unremarkable. SOFT TISSUES AND BONES: No acute bone or soft tissue abnormality. IMPRESSION: 1. No evidence of pulmonary embolism. 2. Scattered left lung nodules, benign and likely infectious or inflammatory, without follow-up suggested per Fleischner Society guidelines. Electronically signed by: Pinkie Pebbles MD 01/24/2024 07:15 PM EDT RP Workstation: [id number]   DG Chest 2 View Result  Date: 01/24/2024 EXAM: 2 VIEW(S) XRAY OF THE CHEST 01/24/2024 06:21:00 AM COMPARISON: None available. CLINICAL HISTORY: Syncope. Reason for exam: syncope. Triage Note: Pt is coming from work, she started feeling very warm, she has passed out in the past and she was trying to manage it but she ended up passing out. She was passed out for 3 to 5 mins. She has since then regained full GCS of 15. She was working in [employer] as well. FINDINGS: LUNGS AND PLEURA: No focal pulmonary opacity. No pulmonary edema. No pleural effusion. No pneumothorax. HEART AND MEDIASTINUM: No acute abnormality of the cardiac and mediastinal silhouettes. BONES AND SOFT TISSUES: No acute osseous abnormality. IMPRESSION: 1. No acute process. Electronically signed by: Waddell Calk MD 01/24/2024 06:25 AM EDT RP Workstation: [id number]     Procedures   Medications Ordered in the ED - No data to display                                  Medical Decision Making Amount and/or Complexity of Data Reviewed Radiology: ordered.  Risk Prescription drug management.   This patient presents to the ED for concern of CP, this involves an extensive number of treatment options, and is a complaint that carries with it a high risk of complications and morbidity.  The differential diagnosis includes ACS, PE, CHF, arrhythmia, pericarditis, myocarditis   Additional history obtained:  Additional history obtained from ED visit less than 12 hours ago, patient ended up leaving AGAINST MEDICAL ADVICE without having CTA.  Will add on troponin and obtain CTA here.  No significant anemia, no significant leukocytosis or electrolyte abnormality.   Imaging Studies ordered:  I ordered imaging studies including CTA PE study I independently visualized and interpreted imaging which showed no evidence of pulmonary embolism, scattered left lung nodules likely infectious versus inflammatory. I agree with the radiologist  interpretation   Cardiac Monitoring: / EKG:  The patient was maintained on a cardiac monitor.  I personally viewed and interpreted the cardiac monitored which showed an underlying rhythm of: Normal sinus    Problem List / ED Course / Critical interventions / Medication management  Patient reports to emergency room with complaint of elevated D-dimer.  She has noted some chest pain and shortness of breath that started after leaving the hospital today.  On my exam she is hemodynamically stable and well-appearing.  Her EKG shows normal sinus and she has 1 negative troponin here thus doubt ACS as cause.  Her chest x-ray earlier today showed no congestion or pneumothorax.  Symptoms do not seem consistent with aortic dissection.  Given positive D-dimer will obtain CT scan to rule out pulmonary embolism.  Will not repeat labs but did review her labs from earlier today. CT scan shows no PE.  Question is possible pneumonia.  Given patient's chest pain, shortness of breath, syncopal episode yesterday and now reported cough will opt to treat.  Started on antibiotic.  Discussed return precautions and follow-up.  She has been given a cardiologist referral for her syncopal episode so she will follow-up with cardiology and primary care for recheck on symptoms. I have reviewed the patients home medicines and have made adjustments as needed   Plan F/u w/ PCP in 2-3d to ensure resolution of sx.  Patient was given return precautions. Patient stable for discharge at this time.  Patient educated on sx/dx and verbalized understanding of plan. Return to ER w/ new or worsening sx.       Final diagnoses:  Chest pain, unspecified type  Pneumonia due to infectious organism, unspecified laterality, unspecified part of lung    ED Discharge Orders          Ordered    amoxicillin (AMOXIL) 500 MG capsule  3 times daily        01/24/24 1940               Anely Spiewak, Warren SAILOR, PA-C 01/24/24 2015    Ruthe Cornet, DO 01/24/24 2024

## 2024-01-24 NOTE — Discharge Instructions (Signed)
 CT scan shows possible pneumonia.  Given your symptoms lets try antibiotic for 7 days.  Ultimately think you need to follow-up with primary care for further evaluation of syncope.  Return to ER with worsening symptoms.    Cardiology should call you to schedule an appointment given your referral earlier this morning from [facility].

## 2024-01-24 NOTE — ED Triage Notes (Signed)
 Pt caox4, ambulatory c/o SOB and CP stating it started at approx 1200 today, a few hrs after being d/c from [facility] ED which she was evaluated for after syncope at work.

## 2024-01-24 NOTE — ED Provider Notes (Signed)
  Physical Exam  BP 123/89   Pulse 66   Temp 98.4 F (36.9 C)   Resp (!) 22   SpO2 100%   Physical Exam  Procedures  Procedures  ED Course / MDM   Clinical Course as of 01/24/24 0823  Fri Jan 24, 2024  0745 Assumed care from Dr Carita. 28 yo F who pw what appears to be vasovagal syncope. Has had multiple episodes in the past. EKG today looks ok.  Lab work that has returned is reassuring.  Pending dimer and orthostatics. Will need to fu with cards.  [RP]  [id number] D-Dimer, Quant(!): 0.63 CTA ordered [RP]  0821 The patient has decided to leave against medical advice because she needs to pick up her son.  Patient did have transient shortness of breath but is no longer having shortness of breath.  Her heart rate in the 60s and satting at 100% on room air.  No signs of a DVT.  Feel that PE is highly unlikely even though her D-dimer is marginally elevated.  Did explain that I cannot fully rule out a PE without CTA and that these can be very dangerous or life-threatening.  Patient states that she still would like to leave at this time which is reasonable.  They have adequate capacity to make medical decisions at this time. The patient was treated to the extent that they would allow and knows that they may return for care at any time. Will dc with cardiology and PCP follow-up.   [RP]    Clinical Course User Index [RP] Yolande Lamar BROCKS, MD   Medical Decision Making Amount and/or Complexity of Data Reviewed Labs: ordered. Decision-making details documented in ED Course. Radiology: ordered. ECG/medicine tests: ordered.      Yolande Lamar BROCKS, MD 01/24/24 [phone]

## 2024-01-24 NOTE — ED Provider Notes (Signed)
 [place] EMERGENCY DEPARTMENT AT [facility] Provider Note   CSN: [id number] Arrival date & time: 01/23/24  1740     Patient presents with: Loss of Consciousness   Courtney Huang is a 28 y.o. female.   Patient here with after syncopal episode.  She has unfortunately waited for over 12 hours in the waiting room.  She works at [employer] as a Production assistant, radio.  She remembers being in the kitchen for a meeting standing for approximately 5 to 10 minutes and began to feel hot and lightheaded with blurry vision.  She went to sit down but lost consciousness.  Did not hit her head.  Colleagues said he should not hit her head.  No chest pain or shortness of breath.  She feels well at this time.  Reports she has passed out in the past while she lived in [place]  and was told it was due to anxiety.  Sounds like she do not have any specific workup other than EKG.  No history of echocardiogram or Holter monitor.  States she has not passed out for several years. Does not take any medications currently.  Denies possibility of pregnancy though she gave birth 10 months ago.  Does have a history of anemia during her pregnancy.  She feels well at this time and denies complaints.  She admits to feeling dizzy and lightheaded with blurry vision and nausea prior to her syncope.  No headache, chest, abdominal pain, neck or back pain.  The history is provided by the patient.  Loss of Consciousness Associated symptoms: no chest pain, no fever, no nausea, no shortness of breath, no vomiting and no weakness        Prior to Admission medications   Not on File    Allergies: Other    Review of Systems  Constitutional:  Negative for activity change, appetite change and fever.  HENT:  Negative for congestion and rhinorrhea.   Respiratory:  Negative for cough, chest tightness and shortness of breath.   Cardiovascular:  Positive for syncope. Negative for chest pain.  Gastrointestinal:  Negative for  abdominal pain, nausea and vomiting.  Genitourinary:  Negative for dysuria and hematuria.  Neurological:  Positive for syncope. Negative for facial asymmetry and weakness.   all other systems are negative except as noted in the HPI and PMH.    Updated Vital Signs BP 116/73   Pulse 64   Temp 98.4 F (36.9 C)   Resp 16   SpO2 100%   Physical Exam Vitals and nursing note reviewed.  Constitutional:      General: She is not in acute distress.    Appearance: She is well-developed.  HENT:     Head: Normocephalic and atraumatic.     Mouth/Throat:     Pharynx: No oropharyngeal exudate.  Eyes:     Conjunctiva/sclera: Conjunctivae normal.     Pupils: Pupils are equal, round, and reactive to light.  Neck:     Comments: No meningismus. Cardiovascular:     Rate and Rhythm: Normal rate and regular rhythm.     Heart sounds: Normal heart sounds. No murmur heard.    Comments: No murmur Pulmonary:     Effort: Pulmonary effort is normal. No respiratory distress.     Breath sounds: Normal breath sounds.  Abdominal:     Palpations: Abdomen is soft.     Tenderness: There is no abdominal tenderness. There is no guarding or rebound.  Musculoskeletal:  General: No tenderness. Normal range of motion.     Cervical back: Normal range of motion and neck supple.  Skin:    General: Skin is warm.  Neurological:     Mental Status: She is alert and oriented to person, place, and time.     Cranial Nerves: No cranial nerve deficit.     Motor: No abnormal muscle tone.     Coordination: Coordination normal.     Comments:  5/5 strength throughout. CN 2-12 intact.Equal grip strength.   Psychiatric:        Behavior: Behavior normal.     (all labs ordered are listed, but only abnormal results are displayed) Labs Reviewed  COMPREHENSIVE METABOLIC PANEL WITH GFR - Abnormal; Notable for the following components:      Result Value   Potassium 3.4 (*)    Glucose, Bld 127 (*)    All other components  within normal limits  CBC - Abnormal; Notable for the following components:   MCH 25.3 (*)    All other components within normal limits  URINALYSIS, ROUTINE W REFLEX MICROSCOPIC - Abnormal; Notable for the following components:   Hgb urine dipstick SMALL (*)    Protein, ur 30 (*)    Bacteria, UA RARE (*)    All other components within normal limits  CBG MONITORING, ED - Abnormal; Notable for the following components:   Glucose-Capillary 121 (*)    All other components within normal limits  HCG, SERUM, QUALITATIVE  D-DIMER, QUANTITATIVE  TROPONIN I (HIGH SENSITIVITY)  TROPONIN I (HIGH SENSITIVITY)    EKG: EKG Interpretation Date/Time:  Friday January 24 2024 06:34:16 EDT Ventricular Rate:  78 PR Interval:  163 QRS Duration:  92 QT Interval:  390 QTC Calculation: 445 R Axis:   67  Text Interpretation: Sinus rhythm No previous ECGs available Confirmed by Carita Senior [phone]) on 01/24/2024 6:42:14 AM  Radiology: [facility] Chest 2 View Result Date: 01/24/2024 EXAM: 2 VIEW(S) XRAY OF THE CHEST 01/24/2024 06:21:00 AM COMPARISON: None available. CLINICAL HISTORY: Syncope. Reason for exam: syncope. Triage Note: Pt is coming from work, she started feeling very warm, she has passed out in the past and she was trying to manage it but she ended up passing out. She was passed out for 3 to 5 mins. She has since then regained full GCS of 15. She was working in [employer] as well. FINDINGS: LUNGS AND PLEURA: No focal pulmonary opacity. No pulmonary edema. No pleural effusion. No pneumothorax. HEART AND MEDIASTINUM: No acute abnormality of the cardiac and mediastinal silhouettes. BONES AND SOFT TISSUES: No acute osseous abnormality. IMPRESSION: 1. No acute process. Electronically signed by: Waddell Calk MD 01/24/2024 06:25 AM EDT RP Workstation: [id number]     Procedures   Medications Ordered in the ED - No data to display  Clinical Course as of 01/24/24 0747  Fri Jan 24, 2024  0745  Assumed care from Dr Carita. 28 yo F who pw vasovagal syncope. Has had multiple episodes. EKG today looks ok. Pending dimer and orthostatics. Will need to fu with cards.  [RP]    Clinical Course User Index [RP] Yolande Lamar BROCKS, MD                                 Medical Decision Making Amount and/or Complexity of Data Reviewed Labs: ordered. Decision-making details documented in ED Course. Radiology: ordered and independent interpretation performed. Decision-making details documented in  ED Course. ECG/medicine tests: ordered and independent interpretation performed. Decision-making details documented in ED Course.   Episode of syncope with prodrome of lightheadedness and feeling hot.  History of recurrent syncope in the past of uncertain etiology.  She is waited for over 12 hours and has stable vital signs.  She is not in any distress.  Neurological exam is nonfocal.    Suspect likely vasovagal syncope.  Will need EKG, orthostatics, fluid challenge.  Low suspicion for cardiogenic syncope.  Low suspicion for PE or cardiac arrhythmia.  No hypoxia tachycardia, chest pain or shortness of breath or increased work of breathing.  Labs are reassuring with stable hemoglobin.  She is not pregnant.  EKG is sinus rhythm.  No prolonged QT, no Brugada.  On recheck, patient is well-appearing.  Awaiting orthostatics.  D-dimer in process given her birth control history and brief episode of shortness of breath earlier.  However she is not tachycardic or hypoxic has no chest pain or shortness of breath currently.  Suspect likely vasovagal syncope.  Will refer to cardiology for further evaluation clued echocardiogram and possible Zio patch or Holter monitor given her report of multiple syncopal episodes in the past several years. No seizure-like activity.  Care to be transferred at shift change pending D-dimer and orthostatics.     Final diagnoses:  Syncope and collapse  Recurrent syncope    ED  Discharge Orders     None          Surabhi Gadea, Garnette, MD 01/24/24 [id number]

## 2024-01-24 NOTE — ED Notes (Signed)
 Provider spoke with patient. Patient wanting to leave AMA due to childcare issues. Patient is aware of risks. DC paperwork given and explained. Patient demonstrated understanding. All questions, comments, and concerns addressed

## 2024-01-24 NOTE — Discharge Instructions (Signed)
 Your testing is reassuring.  Keep yourself hydrated.  Follow-up with a cardiologist for further evaluation of your recurrent syncopal episodes.  You may benefit from echocardiogram as well as Holter monitor testing.  Return to the ED with chest pain, shortness of breath, syncope without warning or any other concerns.

## 2024-02-04 ENCOUNTER — Ambulatory Visit

## 2024-03-23 ENCOUNTER — Ambulatory Visit (INDEPENDENT_AMBULATORY_CARE_PROVIDER_SITE_OTHER): Admitting: Obstetrics

## 2024-03-23 ENCOUNTER — Other Ambulatory Visit (HOSPITAL_COMMUNITY): Payer: Self-pay

## 2024-03-23 ENCOUNTER — Encounter: Payer: Self-pay | Admitting: Obstetrics

## 2024-03-23 VITALS — BP 113/75 | HR 76 | Ht 66.93 in | Wt 130.2 lb

## 2024-03-23 DIAGNOSIS — N941 Unspecified dyspareunia: Secondary | ICD-10-CM | POA: Insufficient documentation

## 2024-03-23 DIAGNOSIS — N3010 Interstitial cystitis (chronic) without hematuria: Secondary | ICD-10-CM | POA: Diagnosis not present

## 2024-03-23 DIAGNOSIS — N3941 Urge incontinence: Secondary | ICD-10-CM | POA: Diagnosis not present

## 2024-03-23 DIAGNOSIS — K5900 Constipation, unspecified: Secondary | ICD-10-CM | POA: Insufficient documentation

## 2024-03-23 LAB — POCT URINALYSIS DIP (CLINITEK)
Bilirubin, UA: NEGATIVE
Glucose, UA: NEGATIVE mg/dL
Ketones, POC UA: NEGATIVE mg/dL
Leukocytes, UA: NEGATIVE
Nitrite, UA: NEGATIVE
POC PROTEIN,UA: NEGATIVE
Spec Grav, UA: 1.015 (ref 1.010–1.025)
Urobilinogen, UA: 1 U/dL
pH, UA: 6 (ref 5.0–8.0)

## 2024-03-23 MED ORDER — CIMETIDINE 200 MG PO TABS
200.0000 mg | ORAL_TABLET | Freq: Two times a day (BID) | ORAL | 2 refills | Status: DC
  Filled 2024-03-23: qty 30, 15d supply, fill #0

## 2024-03-23 NOTE — Progress Notes (Signed)
 New Patient Evaluation and Consultation  Referring Provider: No ref. provider found PCP: Patient, No Pcp Per Date of Service: 03/23/2024  SUBJECTIVE Chief Complaint: New Patient (Initial Visit) (Courtney Huang is a 28 y.o. female her today for IC evaluation )  History of Present Illness: Courtney Huang is a 28 y.o. Black or African-American female seen in consultation at the request of self for evaluation of interstitial cystitis.    Present for appt with best friend Evaluated by [facility] in [place], symptoms started at 28yo when she was on vacation in [place]  and diagnosed at 28yo with IC. Last seen 7 years ago in 2021.  Urinary leakage started after onset of bladder pain at 28yo until around 28yo, now with urgency Used birth control when she was 28yo, then regular cycles after discontinuation.  Denies painful menses or irregular cycles Managed by pyridium with relief, used daily until 27yo. Stopped during pregnancy with resolution of pain.  Reports suprapubic pain that radiates to her pain.  Stopped breastfeeding when her son was 7yo.  Spotting every day since 4 months postpartum, suprapubic pain 10/10 resumed 2 weeks. Topical lidocaine  over urethra with relief, glydo intraurethral lidocaine  gel with relief in the past. Ibuprofen  1600mg /day, Tylenol  2000mg /day now. Has not resume pyridium use. Heating pad with relief. Cystocopy at 21 in 2018 with normal findings per pt.  Multiple ED visits at [facility] in 2018 attributed to UTI due to symptoms.  Denies pain today.  Triggers: acidic juice, alcohol  Desires assistance of pain management ED evaluation x 2 in 01/23/24 for syncope and chest pain attributed to vasovagal syncope. D-dimer 0.63, CTA ordered but pt left AMA after 12hrs due to childcare concerns. History of syncope attributed to anxiety when she lived in [place], recurrent symptoms since 28yo Reports trouble falling sleep   Review of records  significant for: History of anxiety with recurrent syncopal episodes IV iron  for anemia during pregnancy UA during pregnancy 04/22/23 with +leuk/heme/protein, 01/23/24 with + heme/protein  EXAM: CTA CHEST AORTA 01/24/2024 06:48:00 PM   TECHNIQUE: CTA of the chest was performed after the administration of intravenous contrast. Multiplanar reformatted images are provided for review. MIP images are provided for review. Automated exposure control, iterative reconstruction, and/or weight based adjustment of the mA/kV was utilized to reduce the radiation dose to as low as reasonably achievable.   COMPARISON: None available.   CLINICAL HISTORY: Pulmonary embolism (PE) suspected, low to intermediate prob, positive D-dimer.   FINDINGS:   AORTA: No thoracic aortic dissection. No aneurysm.   MEDIASTINUM: No mediastinal lymphadenopathy. The heart and pericardium demonstrate no acute abnormality.   LYMPH NODES: No mediastinal, hilar or axillary lymphadenopathy.   LUNGS AND PLEURA: No evidence of pulmonary embolism. At least 3 scattered left lung nodules measuring up to 5 mm (images 36, 43, and 101). Additional 9 mm ground glass nodule in the posterior left upper lobe (image 48). Given the patient's age, these are all considered benign and likely infectious or inflammatory, without follow up suggested per Fleischner Society guidelines. No focal consolidation or pulmonary edema. No pleural effusion or pneumothorax.   UPPER ABDOMEN: Limited images of the upper abdomen are unremarkable.   SOFT TISSUES AND BONES: No acute bone or soft tissue abnormality.   IMPRESSION: 1. No evidence of pulmonary embolism. 2. Scattered left lung nodules, benign and likely infectious or inflammatory, without follow-up suggested per Fleischner Society guidelines.   Electronically signed by: Pinkie Pebbles MD 01/24/2024 07:15 PM EDT RP  Workstation: [id number]  Urinary Symptoms: Leaks urine with  during sex and with a full bladder, leakage during movement with intercourse Leaks 2 time(s) per days with urgency Pad use: 2-3 liners/ mini-pads per day.   Patient is not bothered by UI symptoms.  Day time voids 4-5.  Nocturia: 0 times per night to void. Voiding dysfunction:  does not empty bladder well.  Patient does not use a catheter to empty bladder.  When urinating, patient feels dribbling after finishing and the need to urinate multiple times in a row Drinks: 112oz water/sparkling water per day since pregnancy  UTIs: 0 UTI's in the last year.   Denies history of blood in urine, kidney or bladder stones, pyelonephritis, bladder cancer, and kidney cancer No results found for the last 90 days.   Pelvic Organ Prolapse Symptoms:                  Patient Denies a feeling of a bulge the vaginal area.   Bowel Symptom: Bowel movements: 2 time(s) per day, previously 2x/week Stool consistency: hard, soft , or loose Type III-V, sometimes VI stool with iced coffee Straining: yes.  Splinting: no.  Incomplete evacuation: no.  Patient Denies accidental bowel leakage / fecal incontinence Bowel regimen: none Last colonoscopy: not due for age based screening HM Colonoscopy   This patient has no relevant Health Maintenance data.     Sexual Function Sexually active: no.  Sexual orientation: Straight Pain with sex: yes, since onset of intercourse at 28yo with deep penetration  Pelvic Pain Admits to pelvic pain Location: suprapubic and lower back bilaterally Pain occurs: during IC flares Prior pain treatment: Azo, Tylenol , Ibuprofen  Improved by: Azo, Tylenol , Ibuprofen  Worsened by: eating and certain sitting positions   Past Medical History:  Past Medical History:  Diagnosis Date   IC (interstitial cystitis)      Past Surgical History:   Past Surgical History:  Procedure Laterality Date   NO PAST SURGERIES       Past OB/GYN History: OB History  Gravida Para Term Preterm  AB Living  1 1 1   1   SAB IAB Ectopic Multiple Live Births     0 1    # Outcome Date GA Lbr Len/2nd Weight Sex Type Anes PTL Lv  1 Term 03/17/23 [redacted]w[redacted]d / 00:18 6 lb 12.6 oz (3.08 kg) M Vag-Spont None  LIV    Vaginal deliveries: 1, 1st laceration repaired by Dr. Von.  Forceps/ Vacuum deliveries: 0, Cesarean section: 0 Menopausal: No, LMP No LMP recorded. Patient has had an injection. Contraception: Depo provera  11/19/23. Nuvaring use in 2018 Last pap smear.  Any history of abnormal pap smears: no.    Component Value Date/Time   DIAGPAP (A) 09/05/2022 1509    - Atypical squamous cells of undetermined significance (ASC-US )   HPVHIGH Positive (A) 09/05/2022 1509   ADEQPAP  09/05/2022 1509    Satisfactory for evaluation; transformation zone component PRESENT.    Medications: Patient has a current medication list which includes the following prescription(s): cimetidine  and amoxicillin .   Allergies: Patient is allergic to other.   Social History:  Social History   Tobacco Use   Smoking status: Never   Smokeless tobacco: Never  Vaping Use   Vaping status: Former   Quit date: 12/07/2021  Substance Use Topics   Alcohol use: Not Currently   Drug use: Never    Relationship status: single Patient lives with her family.   Patient is employed at [employer]. Regular  exercise: No History of abuse: No  Family History:   Family History  Problem Relation Age of Onset   Hypertension Mother    Autoimmune disease Mother    Healthy Father    Bladder Cancer Neg Hx    Uterine cancer Neg Hx    Renal cancer Neg Hx      Review of Systems: ROS   OBJECTIVE Physical Exam: Vitals:   03/23/24 1441  BP: 113/75  Pulse: 76  Weight: 130 lb 3.2 oz (59.1 kg)  Height: 5' 6.93 (1.7 m)   Physical Exam Constitutional:      General: She is not in acute distress.    Appearance: Normal appearance.  Genitourinary:     Bladder and urethral meatus normal.     No lesions in the vagina.      Right Labia: No rash, tenderness, lesions, skin changes or Bartholin's cyst.    Left Labia: No tenderness, lesions, skin changes, Bartholin's cyst or rash.    No vaginal discharge, erythema, tenderness, bleeding, ulceration or granulation tissue.     No vaginal prolapse present.    No vaginal atrophy present.     Right Adnexa: not tender, not full and no mass present.    Left Adnexa: not tender, not full and no mass present.    No cervical motion tenderness, discharge, friability, lesion, polyp or nabothian cyst.     Uterus is not enlarged, fixed, tender, irregular or prolapsed.     No uterine mass detected.    Urethral meatus caruncle not present.    No urethral prolapse, tenderness, mass, hypermobility, discharge or stress urinary incontinence with cough stress test present.     Bladder is not tender, urgency on palpation not present and masses not present.      Pelvic Floor: Levator muscle strength is 4/5.    Levator ani is tender (L sided) and obturator internus is tender (L sided).     No asymmetrical contractions present and no pelvic spasms present.    Pelvic Floor comments: Delayed relaxation.    Symmetrical pelvic sensation, anal wink present and BC reflex present. Cardiovascular:     Rate and Rhythm: Normal rate.  Pulmonary:     Effort: Pulmonary effort is normal. No respiratory distress.  Abdominal:     General: There is no distension.     Palpations: Abdomen is soft. There is no mass.     Tenderness: There is abdominal tenderness.     Hernia: No hernia is present.   Musculoskeletal:       Legs:  Neurological:     Mental Status: She is alert.  Vitals reviewed. Exam conducted with a chaperone present.     Laboratory Results: Lab Results  Component Value Date   COLORU yellow 03/23/2024   CLARITYU clear 03/23/2024   GLUCOSEUR negative 03/23/2024   BILIRUBINUR negative 03/23/2024   SPECGRAV 1.015 03/23/2024   RBCUR trace-intact (A) 03/23/2024   PHUR 6.0  03/23/2024   PROTEINUR 30 (A) 01/23/2024   UROBILINOGEN 1.0 03/23/2024   LEUKOCYTESUR Negative 03/23/2024    Lab Results  Component Value Date   CREATININE 0.93 01/23/2024   CREATININE 0.73 04/22/2023   CREATININE 0.81 04/13/2023    Lab Results  Component Value Date   HGBA1C 5.4 09/05/2022    Lab Results  Component Value Date   HGB 12.3 01/23/2024     ASSESSMENT AND PLAN Ms. Bartl is a 28 y.o. with:  1. IC (interstitial cystitis)   2. Urge urinary incontinence  3. Constipation, unspecified constipation type   4. Dyspareunia in female     IC (interstitial cystitis) Assessment & Plan: - declines catheterized testing, encouraged to return for catheterized testing at the time of bladder pain flares for instillation and catheterized urine testing - prior use of pyridium daily for 6 years, evaluated by urology in [place] with normal cystoscopy per pt around 7 years ago. Office to obtain ROI from urology for review. 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  We also discussed using pyridium and similar over-the-counter medications for pain relief as needed. We discussed the pentad of medications including Tums, an antihistamine such as Vistaril, amitriptyline, and L-arginine.  We also discussed in-office bladder instillations for pain flares, as well as cystoscopy with hydrodistention in the operating room, which can be both diagnostic and therapeutic. She was also given information on the IC Network at https://www.ic-network.com for bladder diet suggestions and patient forums for support. - uses TUMs PRN GERD, encouraged to continue NSAIDs, tylenol , heating pad, topical lidocaine  use, and pyridium as needed for pain - discussed bladder diary to monitor for dietary triggers - encouraged follow-up with  GYN regarding contraception options to reassess irregular spotting and ovulation suppression such as continuous OCPs or resumption of Nuvaring - referral to pelvic floor PT - encouraged stress management and therapy for anxiety and discussed association of mood disorders with pelvic/bladder pain - trial of cimetidine  200mg  at bedtime to assess sleep and bladder pain   Orders: -     POCT URINALYSIS DIP (CLINITEK) -     AMB referral to rehabilitation -     Cimetidine ; Take 1 tablet (200 mg total) by mouth 2 (two) times daily.  Dispense: 30 tablet; Refill: 2  Urge urinary incontinence Assessment & Plan: - POCT UA + heme with spotting since postpartum Depo - We discussed the symptoms of overactive bladder (OAB), which include urinary urgency, urinary frequency, nocturia, with or without urge incontinence.  While we do not know the exact etiology of OAB, several treatment options exist. We discussed management including behavioral therapy (decreasing bladder irritants, urge suppression strategies, timed voids, bladder retraining), physical therapy, medication; for refractory cases posterior tibial nerve stimulation, sacral neuromodulation, and intravesical botulinum toxin injection.  For anticholinergic medications, we discussed the potential side effects of anticholinergics including dry eyes, dry mouth, constipation, cognitive impairment and urinary retention. For Beta-3 agonist medication, we discussed the potential side effect of elevated blood pressure which is more likely to occur in individuals with uncontrolled hypertension. - discussed fluid management  - minimal bother, declines medications at this time - referral to pelvic floor PT and optimize stool consistency  Orders: -     AMB referral to rehabilitation  Constipation, unspecified constipation type Assessment & Plan: - chronic constipation prior to delivery 2x/week - Type III-V with straining 2x/day, sometimes VI stool with iced  coffee - For constipation, we reviewed the importance of a better bowel regimen.  We also discussed the importance of avoiding chronic straining, as it can exacerbate her pelvic floor symptoms; we discussed treating constipation and straining prior to surgery, as postoperative straining can lead to damage to the repair and recurrence of symptoms. We discussed initiating therapy with increasing fluid intake, fiber supplementation, stool softeners, and laxatives such as miralax.  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   - referral to pelvic floor PT - encouraged pelvic floor relaxation exercises and yoga - encouraged pelvic binder for pelvic girdle pain and topical lidocaine /patch use as needed   Time spent: I spent 74 minutes dedicated to the care of this patient on the date of this encounter to include pre-visit review of records, face-to-face time with the patient discussing bladder pain syndrome, dyspareunia, constipation, urgency urinary incontinence, and post visit documentation and ordering medication/ testing.   Lianne ONEIDA Gillis, MD

## 2024-03-23 NOTE — Assessment & Plan Note (Addendum)
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   - referral to pelvic floor PT - encouraged pelvic floor relaxation exercises and yoga - encouraged pelvic binder for pelvic girdle pain and topical lidocaine /patch use as needed

## 2024-03-23 NOTE — Patient Instructions (Addendum)
 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  We also discussed using pyridium and similar over-the-counter medications for pain relief as needed. We discussed the pentad of medications including Tums, an antihistamine such as Vistaril, amitriptyline, and L-arginine.  We also discussed in-office bladder instillations for pain flares, as well as cystoscopy with hydrodistention in the operating room, which can be both diagnostic and therapeutic. She was also given information on the IC Network at https://www.ic-network.com for bladder diet suggestions and patient forums for support.   We discussed the symptoms of overactive bladder (OAB), which include urinary urgency, urinary frequency, night-time urination, with or without urge incontinence.  We discussed management including behavioral therapy (decreasing bladder irritants by following a bladder diet, urge suppression strategies, timed voids, bladder retraining), physical therapy, medication; and for refractory cases posterior tibial nerve stimulation, sacral neuromodulation, and intravesical botulinum toxin injection.   Please follow-up with Dr. Eldonna regarding your birth control.   The origin of pelvic floor muscle spasm can be multifactorial, including primary, reactive to a different pain source, trauma, or even part of a centralized pain syndrome.Treatment options include pelvic floor physical therapy, local (vaginal) or oral  muscle relaxants, pelvic muscle trigger point injections or centrally acting pain medications.     Constipation: Our goal is to achieve formed bowel movements daily or every-other-day.  You may need to try different combinations of the following options to find what works best for you - everybody's body works differently so feel free to adjust the dosages as needed.  Some options to  help maintain bowel health include:  Dietary changes (more leafy greens, vegetables and fruits; less processed foods) Fiber supplementation (Benefiber, FiberCon, Metamucil or Psyllium). Start slow and increase gradually to full dose. Over-the-counter agents such as: stool softeners (Docusate or Colace) and/or laxatives (Miralax, milk of magnesia)  Power Pudding is a natural mixture that may help your constipation.  To make blend 1 cup applesauce, 1 cup wheat bran, and 3/4 cup prune juice, refrigerate and then take 1 tablespoon daily with a large glass of water as needed.   Women should try to eat at least 21 to 25 grams of fiber a day, while men should aim for 30 to 38 grams a day. You can add fiber to your diet with food or a fiber supplement such as psyllium (metamucil), benefiber, or fibercon.   Here's a look at how much dietary fiber is found in some common foods. When buying packaged foods, check the Nutrition Facts label for fiber content. It can vary among brands.  Fruits Serving size Total fiber (grams)*  Raspberries 1 cup 8.0  Pear 1 medium 5.5  Apple, with skin 1 medium 4.5  Banana 1 medium 3.0  Orange 1 medium 3.0  Strawberries 1 cup 3.0   Vegetables Serving size Total fiber (grams)*  Green peas, boiled 1 cup 9.0  Broccoli, boiled 1 cup chopped 5.0  Turnip greens, boiled 1 cup 5.0  Brussels sprouts, boiled 1 cup 4.0  Potato, with skin, baked 1 medium 4.0  Sweet corn, boiled 1 cup 3.5  Cauliflower, raw 1 cup chopped 2.0  Carrot, raw 1 medium 1.5   Grains Serving size Total fiber (grams)*  Spaghetti, whole-wheat, cooked 1 cup 6.0  Barley, pearled, cooked 1 cup 6.0  Bran flakes 3/4 cup 5.5  Quinoa, cooked 1 cup 5.0  Oat bran muffin 1 medium  5.0  Oatmeal, instant, cooked 1 cup 5.0  Popcorn, air-popped 3 cups 3.5  Brown rice, cooked 1 cup 3.5  Bread, whole-wheat 1 slice 2.0  Bread, rye 1 slice 2.0   Legumes, nuts and seeds Serving size Total fiber (grams)*  Split  peas, boiled 1 cup 16.0  Lentils, boiled 1 cup 15.5  Black beans, boiled 1 cup 15.0  Baked beans, canned 1 cup 10.0  Chia seeds 1 ounce 10.0  Almonds 1 ounce (23 nuts) 3.5  Pistachios 1 ounce (49 nuts) 3.0  Sunflower kernels 1 ounce 3.0  *Rounded to nearest 0.5 gram. Source: [employer]	[place]

## 2024-03-23 NOTE — Assessment & Plan Note (Addendum)
-   chronic constipation prior to delivery 2x/week - Type III-V with straining 2x/day, sometimes VI stool with iced coffee - For constipation, we reviewed the importance of a better bowel regimen.  We also discussed the importance of avoiding chronic straining, as it can exacerbate her pelvic floor symptoms; we discussed treating constipation and straining prior to surgery, as postoperative straining can lead to damage to the repair and recurrence of symptoms. We discussed initiating therapy with increasing fluid intake, fiber supplementation, stool softeners, and laxatives such as miralax.  - encouraged squatting position to ensure pelvic floor relaxation and titration of fiber supplementation/miralax - discussed association with pelvic floor disorders - encouraged to monitor for dietary triggers

## 2024-03-23 NOTE — Assessment & Plan Note (Addendum)
-   declines catheterized testing, encouraged to return for catheterized testing at the time of bladder pain flares for instillation and catheterized urine testing - prior use of pyridium daily for 6 years, evaluated by urology in [place] with normal cystoscopy per pt around 7 years ago. Office to obtain ROI from urology for review. 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  We also discussed using pyridium and similar over-the-counter medications for pain relief as needed. We discussed the pentad of medications including Tums, an antihistamine such as Vistaril, amitriptyline, and L-arginine.  We also discussed in-office bladder instillations for pain flares, as well as cystoscopy with hydrodistention in the operating room, which can be both diagnostic and therapeutic. She was also given information on the IC Network at https://www.ic-network.com for bladder diet suggestions and patient forums for support. - uses TUMs PRN GERD, encouraged to continue NSAIDs, tylenol , heating pad, topical lidocaine  use, and pyridium as needed for pain - discussed bladder diary to monitor for dietary triggers - encouraged follow-up with GYN regarding contraception options to reassess irregular spotting and ovulation suppression such as continuous OCPs or resumption of Nuvaring - referral to pelvic floor PT - encouraged stress management and therapy for anxiety and discussed association of mood disorders with pelvic/bladder pain - trial of cimetidine  200mg  at bedtime to assess sleep and bladder pain

## 2024-03-23 NOTE — Assessment & Plan Note (Addendum)
-   POCT UA + heme with spotting since postpartum Depo - We discussed the symptoms of overactive bladder (OAB), which include urinary urgency, urinary frequency, nocturia, with or without urge incontinence.  While we do not know the exact etiology of OAB, several treatment options exist. We discussed management including behavioral therapy (decreasing bladder irritants, urge suppression strategies, timed voids, bladder retraining), physical therapy, medication; for refractory cases posterior tibial nerve stimulation, sacral neuromodulation, and intravesical botulinum toxin injection.  For anticholinergic medications, we discussed the potential side effects of anticholinergics including dry eyes, dry mouth, constipation, cognitive impairment and urinary retention. For Beta-3 agonist medication, we discussed the potential side effect of elevated blood pressure which is more likely to occur in individuals with uncontrolled hypertension. - discussed fluid management  - minimal bother, declines medications at this time - referral to pelvic floor PT and optimize stool consistency

## 2024-04-02 ENCOUNTER — Other Ambulatory Visit (HOSPITAL_COMMUNITY): Payer: Self-pay

## 2024-04-28 ENCOUNTER — Ambulatory Visit: Admitting: Cardiovascular Disease

## 2024-05-12 ENCOUNTER — Other Ambulatory Visit (HOSPITAL_COMMUNITY): Payer: Self-pay

## 2024-05-12 ENCOUNTER — Other Ambulatory Visit: Payer: Self-pay

## 2024-05-12 DIAGNOSIS — N3010 Interstitial cystitis (chronic) without hematuria: Secondary | ICD-10-CM

## 2024-05-12 MED ORDER — CIMETIDINE 200 MG PO TABS
200.0000 mg | ORAL_TABLET | Freq: Two times a day (BID) | ORAL | 2 refills | Status: AC
  Filled 2024-05-12: qty 30, 15d supply, fill #0

## 2024-05-22 ENCOUNTER — Other Ambulatory Visit (HOSPITAL_COMMUNITY): Payer: Self-pay

## 2024-05-25 ENCOUNTER — Ambulatory Visit: Attending: Cardiovascular Disease | Admitting: Cardiovascular Disease

## 2024-05-26 ENCOUNTER — Encounter: Payer: Self-pay | Admitting: Cardiovascular Disease

## 2024-05-28 ENCOUNTER — Ambulatory Visit: Payer: Self-pay | Admitting: Cardiovascular Disease

## 2024-06-17 ENCOUNTER — Ambulatory Visit: Payer: Self-pay | Admitting: Obstetrics and Gynecology

## 2024-06-22 ENCOUNTER — Ambulatory Visit: Admitting: Obstetrics

## 2024-06-24 ENCOUNTER — Ambulatory Visit: Admitting: Obstetrics and Gynecology

## 2024-06-25 ENCOUNTER — Telehealth: Payer: Self-pay | Admitting: Physical Therapy

## 2024-06-25 ENCOUNTER — Encounter: Payer: Self-pay | Attending: Obstetrics | Admitting: Physical Therapy

## 2024-06-25 NOTE — Telephone Encounter (Signed)
 Called patient about her missed evaluation appointment and she reported she does not have insurance at this time and working on getting it.. Therapist let patient know she is to call to [facility] an appointment once she has her insurance reinstated.  Courtney Huang, PT @12 /18/25@ 12:02 PM

## 2024-07-14 ENCOUNTER — Ambulatory Visit: Payer: Self-pay | Admitting: Cardiovascular Disease

## 2024-07-16 ENCOUNTER — Encounter: Payer: Self-pay | Admitting: Physical Therapy

## 2024-07-20 ENCOUNTER — Ambulatory Visit: Payer: Self-pay | Attending: Cardiovascular Disease | Admitting: Cardiovascular Disease

## 2024-07-23 ENCOUNTER — Encounter: Admitting: Physical Therapy

## 2024-07-23 ENCOUNTER — Encounter: Payer: Self-pay | Admitting: Cardiovascular Disease

## 2024-07-27 ENCOUNTER — Ambulatory Visit: Admitting: Obstetrics

## 2024-07-30 ENCOUNTER — Encounter: Admitting: Physical Therapy
